# Patient Record
Sex: Male | Born: 1955 | Race: White | Hispanic: No | Smoking: Never smoker
Health system: Southern US, Community
[De-identification: ages and names within clinical notes are randomized; demographics above are authoritative.]

## PROBLEM LIST (undated history)

## (undated) DIAGNOSIS — G629 Polyneuropathy, unspecified: Secondary | ICD-10-CM

## (undated) HISTORY — PX: HEMORRHOID SURGERY: SHX153

---

## 2014-03-31 DIAGNOSIS — G6289 Other specified polyneuropathies: Secondary | ICD-10-CM | POA: Diagnosis present

## 2016-09-05 ENCOUNTER — Other Ambulatory Visit: Payer: Self-pay

## 2016-09-05 ENCOUNTER — Emergency Department
Admission: EM | Admit: 2016-09-05 | Discharge: 2016-09-05 | Disposition: A | Discharge status: Home / Self Care | Payer: Medicare Other | Attending: Emergency Medicine | Admitting: Emergency Medicine

## 2016-09-05 ENCOUNTER — Emergency Department: Payer: Medicare Other

## 2016-09-05 DIAGNOSIS — A084 Viral intestinal infection, unspecified: Secondary | ICD-10-CM | POA: Insufficient documentation

## 2016-09-05 DIAGNOSIS — R1084 Generalized abdominal pain: Secondary | ICD-10-CM | POA: Diagnosis present

## 2016-09-05 HISTORY — DX: Polyneuropathy, unspecified: G62.9

## 2016-09-05 LAB — URINALYSIS, COMPLETE (UACMP) WITH MICROSCOPIC
Bacteria, UA: NONE SEEN
Bilirubin Urine: NEGATIVE
Glucose, UA: NEGATIVE mg/dL
Ketones, ur: NEGATIVE mg/dL
LEUKOCYTES UA: NEGATIVE
Nitrite: NEGATIVE
Protein, ur: NEGATIVE mg/dL
SPECIFIC GRAVITY, URINE: 1.015 (ref 1.005–1.030)
SQUAMOUS EPITHELIAL / LPF: NONE SEEN
pH: 5 (ref 5.0–8.0)

## 2016-09-05 LAB — CBC
HCT: 41.9 % (ref 40.0–52.0)
Hemoglobin: 13.8 g/dL (ref 13.0–18.0)
MCH: 29.7 pg (ref 26.0–34.0)
MCHC: 33 g/dL (ref 32.0–36.0)
MCV: 90 fL (ref 80.0–100.0)
PLATELETS: 234 10*3/uL (ref 150–440)
RBC: 4.65 MIL/uL (ref 4.40–5.90)
RDW: 13.4 % (ref 11.5–14.5)
WBC: 18.8 10*3/uL — ABNORMAL HIGH (ref 3.8–10.6)

## 2016-09-05 LAB — COMPREHENSIVE METABOLIC PANEL
ALT: 21 U/L (ref 17–63)
AST: 32 U/L (ref 15–41)
Albumin: 4.4 g/dL (ref 3.5–5.0)
Alkaline Phosphatase: 69 U/L (ref 38–126)
Anion gap: 9 (ref 5–15)
BUN: 18 mg/dL (ref 6–20)
CALCIUM: 9.2 mg/dL (ref 8.9–10.3)
CHLORIDE: 106 mmol/L (ref 101–111)
CO2: 24 mmol/L (ref 22–32)
CREATININE: 1.24 mg/dL (ref 0.61–1.24)
Glucose, Bld: 134 mg/dL — ABNORMAL HIGH (ref 65–99)
Potassium: 4.3 mmol/L (ref 3.5–5.1)
Sodium: 139 mmol/L (ref 135–145)
Total Bilirubin: 0.3 mg/dL (ref 0.3–1.2)
Total Protein: 8.1 g/dL (ref 6.5–8.1)

## 2016-09-05 LAB — INFLUENZA PANEL BY PCR (TYPE A & B)
Influenza A By PCR: NEGATIVE
Influenza B By PCR: NEGATIVE

## 2016-09-05 LAB — TROPONIN I

## 2016-09-05 LAB — LIPASE, BLOOD: LIPASE: 17 U/L (ref 11–51)

## 2016-09-05 MED ORDER — ONDANSETRON 4 MG PO TBDP: 4.0000 mg | ORAL_TABLET | Freq: Three times a day (TID) | ORAL | 0 refills | Status: DC | PRN

## 2016-09-05 MED ORDER — MAGNESIUM SULFATE 2 GM/50ML IV SOLN
2.0000 g | Freq: Once | INTRAVENOUS | Status: AC
  Administered 2016-09-05: 2 g via INTRAVENOUS
  Filled 2016-09-05: qty 50

## 2016-09-05 MED ORDER — LOPERAMIDE HCL 2 MG PO TABS: 2.0000 mg | ORAL_TABLET | Freq: Four times a day (QID) | ORAL | 0 refills | Status: DC | PRN

## 2016-09-05 MED ORDER — ONDANSETRON HCL 4 MG/2ML IJ SOLN
4.0000 mg | Freq: Once | INTRAMUSCULAR | Status: AC
  Administered 2016-09-05: 4 mg via INTRAVENOUS
  Filled 2016-09-05: qty 2

## 2016-09-05 MED ORDER — SODIUM CHLORIDE 0.9 % IV BOLUS (SEPSIS)
1000.0000 mL | Freq: Once | INTRAVENOUS | Status: AC
  Administered 2016-09-05: 1000 mL via INTRAVENOUS

## 2016-09-05 MED ORDER — KETOROLAC TROMETHAMINE 30 MG/ML IJ SOLN
15.0000 mg | Freq: Once | INTRAMUSCULAR | Status: AC
  Administered 2016-09-05: 15 mg via INTRAVENOUS
  Filled 2016-09-05: qty 1

## 2016-09-05 MED ORDER — IOPAMIDOL (ISOVUE-300) INJECTION 61%
30.0000 mL | Freq: Once | INTRAVENOUS | Status: AC
  Administered 2016-09-05: 30 mL via ORAL

## 2016-09-05 MED ORDER — IOPAMIDOL (ISOVUE-300) INJECTION 61%
100.0000 mL | Freq: Once | INTRAVENOUS | Status: AC | PRN
  Administered 2016-09-05: 100 mL via INTRAVENOUS

## 2016-09-05 NOTE — ED Triage Notes (Signed)
Pt arrived via ems for c/o N/V since 630am and body aches

## 2016-09-05 NOTE — ED Notes (Signed)
Pt is aware that urine sample is needed - at this time is unable to void

## 2016-09-05 NOTE — ED Notes (Signed)
Pt arrived via ems for c/o N/V since 630am and body aches - MD at bedside

## 2016-09-05 NOTE — ED Provider Notes (Signed)
[facility] Emergency Department Provider Note  Time seen: 12:20 PM  I have reviewed the triage vital signs and the nursing notes.   HISTORY  Chief Complaint Nausea and Vomiting    HPI Drew Holt is a 60 y.o. male who presents to the emergency department by EMS for nausea, vomiting, diarrhea. According to the patient he was feeling well yesterday, he awoke early this morning feeling very nauseated, had multiple episodes of vomiting. States he's had 3 episodes of diarrhea since this morning. Unable to keep down any fluids due to the nausea and vomiting so he came to the emergency department for evaluation. States mild diffuse abdominal pain/discomfort. States bodyaches. Denies cough, congestion or fever. Denies black or bloody stool or vomit.  No past medical history on file.  There are no active problems to display for this patient.   No past surgical history on file.  Prior to Admission medications   Not on File    Allergies not on file  No family history on file.  Social History Social History  Substance Use Topics  . Smoking status: Not on file  . Smokeless tobacco: Not on file  . Alcohol use Not on file    Review of Systems Constitutional: Negative for fever. Cardiovascular: Negative for chest pain. Respiratory: Negative for shortness of breath. Gastrointestinal: Mild diffuse abdominal pain. Positive for nausea, vomiting, diarrhea. Genitourinary: Negative for dysuria. Neurological: Negative for headache 10-point ROS otherwise negative.  ____________________________________________   PHYSICAL EXAM:  VITAL SIGNS: ED Triage Vitals [09/05/16 1219]  Enc Vitals Group     BP 105/73     Pulse Rate 81     Resp 15     Temp 97.4 F (36.3 C)     Temp Source Oral     SpO2 96 %     Weight      Height      Head Circumference      Peak Flow      Pain Score      Pain Loc      Pain Edu?      Excl. in GC?     Constitutional: Alert and  oriented. Nauseated, sitting upright in bed holding emesis bag. Very thin habitus. Eyes: Normal exam ENT   Head: Normocephalic and atraumatic.   Mouth/Throat: Mucous membranes are moist. Cardiovascular: Normal rate, regular rhythm. No murmur Respiratory: Normal respiratory effort without tachypnea nor retractions. Breath sounds are clear Gastrointestinal: Soft, mild diffuse abdominal tenderness palpation, no rebound or guarding. No focal tenderness identified. Musculoskeletal: Nontender with normal range of motion in all extremities.  Neurologic:  Normal speech and language. No gross focal neurologic deficits  Skin:  Skin is warm, dry and intact.  Psychiatric: Mood and affect are normal.  ____________________________________________    EKG  EKG reviewed and interpreted by myself shows normal sinus rhythm at 88 bpm, narrow QRS, normal axis, normal intervals, nonspecific but no concerning ST changes.  ____________________________________________    RADIOLOGY  IMPRESSION: 1. Few prominent/borderline dilated jejunal bowel loops in the left upper quadrant but with migration of contrast into distal small bowel, findings are favored to represent mild ileus or gastroenteritis type pattern. No evidence for high-grade mechanical small bowel obstruction. 2. Streaky airspace disease in the left lower lobe may reflect atelectasis or mild infiltrate.  ____________________________________________   INITIAL IMPRESSION / ASSESSMENT AND PLAN / ED COURSE  Pertinent labs & imaging results that were available during my care of the patient were reviewed by  me and considered in my medical decision making (see chart for details).  Patient presents the emergency department with nausea, vomiting, diarrhea since early this morning. Patient states 3 or 4 episodes of diarrhea today, multiple episodes of vomiting unable to keep down any fluids. We will check labs, IV hydrate, and treat nausea and  body aches, we will also send an influenza screen.  Labs are largely within normal limits besides a moderate leukocytosis of 18,000. Given the patient's diffuse abdominal pain/tenderness. We'll obtain a CT scan to further evaluate.  CT scan most consistent with gastroenteritis. Atelectasis first mild infiltrate but no cough, congestion, or fever. Patient will be discharged home with Zofran and over-the-counter loperamide.  ____________________________________________   FINAL CLINICAL IMPRESSION(S) / ED DIAGNOSES  Nausea vomiting diarrhea Muscle cramps Gastroenteritis    Minna AntisKevin Keanthony Poole, MD 09/05/16 [phone]

## 2019-02-21 ENCOUNTER — Emergency Department
Admission: EM | Admit: 2019-02-21 | Discharge: 2019-02-21 | Disposition: A | Discharge status: Home / Self Care | Payer: Medicare HMO | Attending: Emergency Medicine | Admitting: Emergency Medicine

## 2019-02-21 ENCOUNTER — Other Ambulatory Visit: Payer: Self-pay

## 2019-02-21 DIAGNOSIS — Z20828 Contact with and (suspected) exposure to other viral communicable diseases: Secondary | ICD-10-CM | POA: Diagnosis not present

## 2019-02-21 DIAGNOSIS — K2900 Acute gastritis without bleeding: Secondary | ICD-10-CM | POA: Diagnosis not present

## 2019-02-21 DIAGNOSIS — R112 Nausea with vomiting, unspecified: Secondary | ICD-10-CM | POA: Diagnosis present

## 2019-02-21 LAB — COMPREHENSIVE METABOLIC PANEL
ALT: 22 U/L (ref 0–44)
AST: 26 U/L (ref 15–41)
Albumin: 4.6 g/dL (ref 3.5–5.0)
Alkaline Phosphatase: 63 U/L (ref 38–126)
Anion gap: 11 (ref 5–15)
BUN: 17 mg/dL (ref 8–23)
CO2: 26 mmol/L (ref 22–32)
Calcium: 9.1 mg/dL (ref 8.9–10.3)
Chloride: 103 mmol/L (ref 98–111)
Creatinine, Ser: 1.31 mg/dL — ABNORMAL HIGH (ref 0.61–1.24)
GFR calc Af Amer: >60 mL/min (ref >60)
GFR calc non Af Amer: 58 mL/min — ABNORMAL LOW (ref >60)
Glucose, Bld: 111 mg/dL — ABNORMAL HIGH (ref 70–99)
Potassium: 3.7 mmol/L (ref 3.5–5.1)
Sodium: 140 mmol/L (ref 135–145)
Total Bilirubin: 0.6 mg/dL (ref 0.3–1.2)
Total Protein: 8.1 g/dL (ref 6.5–8.1)

## 2019-02-21 LAB — URINALYSIS, COMPLETE (UACMP) WITH MICROSCOPIC
Bacteria, UA: NONE SEEN
Bilirubin Urine: NEGATIVE
Glucose, UA: NEGATIVE mg/dL
Ketones, ur: NEGATIVE mg/dL
Leukocytes,Ua: NEGATIVE
Nitrite: NEGATIVE
Protein, ur: NEGATIVE mg/dL
Specific Gravity, Urine: 1.017 (ref 1.005–1.030)
Squamous Epithelial / LPF: NONE SEEN (ref 0–5)
pH: 5 (ref 5.0–8.0)

## 2019-02-21 LAB — SARS CORONAVIRUS 2 BY RT PCR (HOSPITAL ORDER, PERFORMED IN ~~LOC~~ HOSPITAL LAB): SARS Coronavirus 2: NEGATIVE

## 2019-02-21 LAB — CBC
HCT: 37.5 % — ABNORMAL LOW (ref 39.0–52.0)
Hemoglobin: 12.8 g/dL — ABNORMAL LOW (ref 13.0–17.0)
MCH: 30.9 pg (ref 26.0–34.0)
MCHC: 34.1 g/dL (ref 30.0–36.0)
MCV: 90.6 fL (ref 80.0–100.0)
Platelets: 212 10*3/uL (ref 150–400)
RBC: 4.14 MIL/uL — ABNORMAL LOW (ref 4.22–5.81)
RDW: 12.7 % (ref 11.5–15.5)
WBC: 14.3 10*3/uL — ABNORMAL HIGH (ref 4.0–10.5)
nRBC: 0 % (ref 0.0–0.2)

## 2019-02-21 LAB — LIPASE, BLOOD: Lipase: 26 U/L (ref 11–51)

## 2019-02-21 MED ORDER — ONDANSETRON HCL 4 MG/2ML IJ SOLN
4.0000 mg | Freq: Once | INTRAMUSCULAR | Status: AC
  Administered 2019-02-21: 08:00:00 4 mg via INTRAVENOUS
  Filled 2019-02-21: qty 2

## 2019-02-21 MED ORDER — SODIUM CHLORIDE 0.9% FLUSH: 3.0000 mL | Freq: Once | INTRAVENOUS | Status: DC

## 2019-02-21 MED ORDER — FAMOTIDINE 20 MG PO TABS
20.0000 mg | ORAL_TABLET | Freq: Once | ORAL | Status: AC
  Administered 2019-02-21: 09:00:00 20 mg via ORAL
  Filled 2019-02-21: qty 1

## 2019-02-21 MED ORDER — ACETAMINOPHEN 500 MG PO TABS
1000.0000 mg | ORAL_TABLET | Freq: Once | ORAL | Status: AC
  Administered 2019-02-21: 1000 mg via ORAL
  Filled 2019-02-21: qty 2

## 2019-02-21 MED ORDER — SODIUM CHLORIDE 0.9 % IV SOLN
Freq: Once | INTRAVENOUS | Status: AC
  Administered 2019-02-21: 08:00:00 via INTRAVENOUS

## 2019-02-21 MED ORDER — ONDANSETRON 4 MG PO TBDP: 4.0000 mg | ORAL_TABLET | Freq: Three times a day (TID) | ORAL | 0 refills | Status: DC | PRN

## 2019-02-21 MED ORDER — FAMOTIDINE 20 MG PO TABS: 20.0000 mg | ORAL_TABLET | Freq: Two times a day (BID) | ORAL | 1 refills | Status: DC

## 2019-02-21 NOTE — ED Triage Notes (Signed)
Patient c/o chills, N/V, and generalized body aches.

## 2019-02-21 NOTE — ED Provider Notes (Signed)
[facility] Emergency Department Provider Note       Time seen: ----------------------------------------- 7:29 AM on 02/21/2019 -----------------------------------------   I have reviewed the triage vital signs and the nursing notes.  HISTORY   Chief Complaint Chills; Emesis; and Generalized Body Aches   HPI Drew Holt is a 63 y.o. male with a history of neuropathy who presents to the ED for chills with nausea, vomiting and generalized body aches.  Patient is worried he has coronavirus because there are small children in his family.  He does not drink or smoke.  Past Medical History:  Diagnosis Date  . Neuropathy     There are no active problems to display for this patient.   History reviewed. No pertinent surgical history.  Allergies Patient has no known allergies.  Social History Social History   Tobacco Use  . Smoking status: Never Smoker  . Smokeless tobacco: Never Used  Substance Use Topics  . Alcohol use: No  . Drug use: Not on file   Review of Systems Constitutional: Negative for fever.  Positive for chills Cardiovascular: Negative for chest pain. Respiratory: Negative for shortness of breath. Gastrointestinal: Negative for abdominal pain, positive for nausea and vomiting Musculoskeletal: Negative for back pain.  Positive for muscle aches Skin: Negative for rash. Neurological: Negative for headaches, focal weakness or numbness.  All systems negative/normal/unremarkable except as stated in the HPI  ____________________________________________   PHYSICAL EXAM:  VITAL SIGNS: ED Triage Vitals  Enc Vitals Group     BP 02/21/19 0622 (!) 154/99     Pulse Rate 02/21/19 0622 (!) 119     Resp 02/21/19 0622 18     Temp 02/21/19 0622 99.4 F (37.4 C)     Temp src --      SpO2 02/21/19 0622 97 %     Weight 02/21/19 0624 105 lb 13.1 oz (48 kg)     Height --      Head Circumference --      Peak Flow --      Pain Score 02/21/19  0622 8     Pain Loc --      Pain Edu? --      Excl. in GC? --    Constitutional: Alert and oriented. Well appearing and in no distress. Eyes: Conjunctivae are normal. Normal extraocular movements. Cardiovascular: Normal rate, regular rhythm. No murmurs, rubs, or gallops. Respiratory: Normal respiratory effort without tachypnea nor retractions. Breath sounds are clear and equal bilaterally. No wheezes/rales/rhonchi. Gastrointestinal: Soft and nontender. Normal bowel sounds Musculoskeletal: Nontender with normal range of motion in extremities. No lower extremity tenderness nor edema. Neurologic:  Normal speech and language. No gross focal neurologic deficits are appreciated.  Skin:  Skin is warm, dry and intact. No rash noted. Psychiatric: Mood and affect are normal. Speech and behavior are normal.  ____________________________________________  ED COURSE:  As part of my medical decision making, I reviewed the following data within the electronic MEDICAL RECORD NUMBER History obtained from family if available, nursing notes, old chart and ekg, as well as notes from prior ED visits. Patient presented for chills with nausea and vomiting, we will assess with labs and imaging as indicated at this time.   Procedures  Romere Gardy was evaluated in Emergency Department on 02/21/2019 for the symptoms described in the history of present illness. He was evaluated in the context of the global COVID-19 pandemic, which necessitated consideration that the patient might be at risk for infection with the SARS-CoV-2 virus  that causes COVID-19. Institutional protocols and algorithms that pertain to the evaluation of patients at risk for COVID-19 are in a state of rapid change based on information released by regulatory bodies including the CDC and federal and state organizations. These policies and algorithms were followed during the patient's care in the ED.  ____________________________________________   LABS  (pertinent positives/negatives)  Labs Reviewed  COMPREHENSIVE METABOLIC PANEL - Abnormal; Notable for the following components:      Result Value   Glucose, Bld 111 (*)    Creatinine, Ser 1.31 (*)    GFR calc non Af Amer 58 (*)    All other components within normal limits  CBC - Abnormal; Notable for the following components:   WBC 14.3 (*)    RBC 4.14 (*)    Hemoglobin 12.8 (*)    HCT 37.5 (*)    All other components within normal limits  SARS CORONAVIRUS 2 (HOSPITAL ORDER, PERFORMED IN [facility])  LIPASE, BLOOD  URINALYSIS, COMPLETE (UACMP) WITH MICROSCOPIC   ___________________________________________   DIFFERENTIAL DIAGNOSIS   Gastritis, gastroenteritis, dehydration, electrolyte abnormality  FINAL ASSESSMENT AND PLAN  Gastritis   Plan: The patient had presented for chills with nausea and vomiting and aches. Patient's labs did reveal some leukocytosis.  Patient was given IV fluids and antiemetics.  We also gave antacids.  This is likely combination of gastroenteritis and GERD or gastritis.  Patient is feeling better, he is cleared for outpatient follow-up.   Ulice Dash, MD    Note: This note was generated in part or whole with voice recognition software. Voice recognition is usually quite accurate but there are transcription errors that can and very often do occur. I apologize for any typographical errors that were not detected and corrected.     Emily Filbert, MD 02/21/19 [phone]

## 2019-11-11 ENCOUNTER — Encounter: Payer: Self-pay | Admitting: Emergency Medicine

## 2019-11-11 ENCOUNTER — Other Ambulatory Visit: Payer: Self-pay

## 2019-11-11 DIAGNOSIS — R112 Nausea with vomiting, unspecified: Secondary | ICD-10-CM | POA: Insufficient documentation

## 2019-11-11 DIAGNOSIS — Z79899 Other long term (current) drug therapy: Secondary | ICD-10-CM | POA: Insufficient documentation

## 2019-11-11 DIAGNOSIS — R531 Weakness: Secondary | ICD-10-CM | POA: Diagnosis present

## 2019-11-11 DIAGNOSIS — R1084 Generalized abdominal pain: Secondary | ICD-10-CM | POA: Diagnosis not present

## 2019-11-11 LAB — CBC
HCT: 42.2 % (ref 39.0–52.0)
Hemoglobin: 13.7 g/dL (ref 13.0–17.0)
MCH: 29.9 pg (ref 26.0–34.0)
MCHC: 32.5 g/dL (ref 30.0–36.0)
MCV: 92.1 fL (ref 80.0–100.0)
Platelets: 268 10*3/uL (ref 150–400)
RBC: 4.58 MIL/uL (ref 4.22–5.81)
RDW: 12.5 % (ref 11.5–15.5)
WBC: 14.5 10*3/uL — ABNORMAL HIGH (ref 4.0–10.5)
nRBC: 0 % (ref 0.0–0.2)

## 2019-11-11 LAB — COMPREHENSIVE METABOLIC PANEL
ALT: 23 U/L (ref 0–44)
AST: 26 U/L (ref 15–41)
Albumin: 4.9 g/dL (ref 3.5–5.0)
Alkaline Phosphatase: 74 U/L (ref 38–126)
Anion gap: 10 (ref 5–15)
BUN: 21 mg/dL (ref 8–23)
CO2: 28 mmol/L (ref 22–32)
Calcium: 9.5 mg/dL (ref 8.9–10.3)
Chloride: 102 mmol/L (ref 98–111)
Creatinine, Ser: 1.25 mg/dL — ABNORMAL HIGH (ref 0.61–1.24)
GFR calc Af Amer: >60 mL/min (ref >60)
GFR calc non Af Amer: >60 mL/min (ref >60)
Glucose, Bld: 138 mg/dL — ABNORMAL HIGH (ref 70–99)
Potassium: 4.8 mmol/L (ref 3.5–5.1)
Sodium: 140 mmol/L (ref 135–145)
Total Bilirubin: 0.7 mg/dL (ref 0.3–1.2)
Total Protein: 8.9 g/dL — ABNORMAL HIGH (ref 6.5–8.1)

## 2019-11-11 LAB — LIPASE, BLOOD: Lipase: 23 U/L (ref 11–51)

## 2019-11-11 MED ORDER — ONDANSETRON 4 MG PO TBDP
4.0000 mg | ORAL_TABLET | Freq: Once | ORAL | Status: AC | PRN
  Administered 2019-11-11: 4 mg via ORAL
  Filled 2019-11-11: qty 1

## 2019-11-11 NOTE — ED Triage Notes (Addendum)
Pt arrived via POV - drove himself to ED, with reports of weakness and nausea and vomiting that started today. Pt states he took nausea medication that was prescribed to him a while ago.  Denies any abd pain at this time.   Pt placed in wheelchair on arrival due to weakness and off balance. Pt states he is normally off balance.  Pt is alert and oriented on arrival.  Pt states he has been around others with the same sxs, but were negative for COVID.

## 2019-11-12 ENCOUNTER — Emergency Department: Payer: Medicare HMO

## 2019-11-12 ENCOUNTER — Emergency Department
Admission: EM | Admit: 2019-11-12 | Discharge: 2019-11-12 | Disposition: A | Discharge status: Home / Self Care | Payer: Medicare HMO | Attending: Emergency Medicine | Admitting: Emergency Medicine

## 2019-11-12 DIAGNOSIS — R531 Weakness: Secondary | ICD-10-CM

## 2019-11-12 DIAGNOSIS — R1084 Generalized abdominal pain: Secondary | ICD-10-CM

## 2019-11-12 DIAGNOSIS — R112 Nausea with vomiting, unspecified: Secondary | ICD-10-CM

## 2019-11-12 LAB — TROPONIN I (HIGH SENSITIVITY): Troponin I (High Sensitivity): 3 ng/L (ref <18)

## 2019-11-12 LAB — URINALYSIS, COMPLETE (UACMP) WITH MICROSCOPIC
Bacteria, UA: NONE SEEN
Bilirubin Urine: NEGATIVE
Glucose, UA: NEGATIVE mg/dL
Hgb urine dipstick: NEGATIVE
Ketones, ur: 5 mg/dL — AB
Leukocytes,Ua: NEGATIVE
Nitrite: NEGATIVE
Protein, ur: NEGATIVE mg/dL
Specific Gravity, Urine: 1.02 (ref 1.005–1.030)
WBC, UA: NONE SEEN WBC/hpf (ref 0–5)
pH: 5 (ref 5.0–8.0)

## 2019-11-12 MED ORDER — SODIUM CHLORIDE 0.9 % IV BOLUS
1000.0000 mL | Freq: Once | INTRAVENOUS | Status: AC
  Administered 2019-11-12: 1000 mL via INTRAVENOUS

## 2019-11-12 MED ORDER — LACTULOSE 10 GM/15ML PO SOLN: 20.0000 g | Freq: Every day | ORAL | 0 refills | Status: DC | PRN

## 2019-11-12 MED ORDER — ONDANSETRON HCL 4 MG/2ML IJ SOLN
4.0000 mg | Freq: Once | INTRAMUSCULAR | Status: AC
  Administered 2019-11-12: 4 mg via INTRAVENOUS
  Filled 2019-11-12: qty 2

## 2019-11-12 MED ORDER — ONDANSETRON 4 MG PO TBDP: 4.0000 mg | ORAL_TABLET | Freq: Three times a day (TID) | ORAL | 0 refills | Status: DC | PRN

## 2019-11-12 MED ORDER — MORPHINE SULFATE (PF) 4 MG/ML IV SOLN
4.0000 mg | Freq: Once | INTRAVENOUS | Status: AC
  Administered 2019-11-12: 4 mg via INTRAVENOUS
  Filled 2019-11-12: qty 1

## 2019-11-12 MED ORDER — FAMOTIDINE IN NACL 20-0.9 MG/50ML-% IV SOLN
20.0000 mg | Freq: Once | INTRAVENOUS | Status: AC
  Administered 2019-11-12: 20 mg via INTRAVENOUS
  Filled 2019-11-12: qty 50

## 2019-11-12 NOTE — ED Notes (Signed)
E-signature not working at this time. Pt verbalized understanding of D/C instructions, prescriptions and follow up care with no further questions at this time. Pt in NAD and ambulatory at time of D/C.  

## 2019-11-12 NOTE — ED Provider Notes (Signed)
[facility] Emergency Department Provider Note   ____________________________________________   First MD Initiated Contact with Patient 11/12/19 0121     (approximate)  I have reviewed the triage vital signs and the nursing notes.   HISTORY  Chief Complaint Nausea, Weakness, and Emesis    HPI Drew Holt is a 64 y.o. male who presents to the ED from home with a chief complaint of generalized weakness, nausea and vomiting which began in the morning.  Associated with abdominal cramps when he is vomiting.  No diarrhea; he states last BM yesterday.  Patient takes daily hydrocodone but states he has been weaning himself to lower doses recently.  Family members with similar symptoms who tested negative for COVID-19.  Denies fever, chills, cough, chest pain, shortness of breath, dysuria.  Denies recent travel.       Past Medical History:  Diagnosis Date  . Neuropathy     There are no problems to display for this patient.   History reviewed. No pertinent surgical history.  Prior to Admission medications   Medication Sig Start Date End Date Taking? Authorizing Provider  famotidine (PEPCID) 20 MG tablet Take 1 tablet (20 mg total) by mouth 2 (two) times daily. 02/21/19   Emily Filbert, MD  lactulose (CHRONULAC) 10 GM/15ML solution Take 30 mLs (20 g total) by mouth daily as needed for mild constipation. 11/12/19   Irean Hong, MD  loperamide (IMODIUM A-D) 2 MG tablet Take 1 tablet (2 mg total) by mouth 4 (four) times daily as needed for diarrhea or loose stools. Patient not taking: Reported on 02/21/2019 09/05/16   Minna Antis, MD  ondansetron (ZOFRAN ODT) 4 MG disintegrating tablet Take 1 tablet (4 mg total) by mouth every 8 (eight) hours as needed for nausea or vomiting. 11/12/19   Irean Hong, MD    Allergies Patient has no known allergies.  No family history on file.  Social History Social History   Tobacco Use  . Smoking status: Never  Smoker  . Smokeless tobacco: Never Used  Substance Use Topics  . Alcohol use: No  . Drug use: Not on file    Review of Systems  Constitutional: No fever/chills Eyes: No visual changes. ENT: No sore throat. Cardiovascular: Denies chest pain. Respiratory: Denies shortness of breath. Gastrointestinal: Positive for abdominal cramps, nausea and vomiting.  No diarrhea.  No constipation. Genitourinary: Negative for dysuria. Musculoskeletal: Negative for back pain. Skin: Negative for rash. Neurological: Negative for headaches, focal weakness or numbness.   ____________________________________________   PHYSICAL EXAM:  VITAL SIGNS: ED Triage Vitals  Enc Vitals Group     BP 11/11/19 2237 116/69     Pulse Rate 11/11/19 2237 (!) 110     Resp 11/11/19 2237 18     Temp 11/11/19 2237 98.5 F (36.9 C)     Temp Source 11/11/19 2237 Oral     SpO2 11/11/19 2237 97 %     Weight 11/11/19 2239 122 lb (55.3 kg)     Height 11/11/19 2239 5\' 4"  (1.626 m)     Head Circumference --      Peak Flow --      Pain Score 11/11/19 2238 8     Pain Loc --      Pain Edu? --      Excl. in GC? --     Constitutional: Alert and oriented.  Uncomfortable appearing and in mild acute distress. Eyes: Conjunctivae are normal. PERRL. EOMI. Head: Atraumatic. Nose: No  congestion/rhinnorhea. Mouth/Throat: Mucous membranes are mildly dry.   Neck: No stridor.   Cardiovascular: Normal rate, regular rhythm. Grossly normal heart sounds.  Good peripheral circulation. Respiratory: Normal respiratory effort.  No retractions. Lungs CTAB. Gastrointestinal: Soft and nontender to light or deep palpation. No distention. No abdominal bruits. No CVA tenderness. Musculoskeletal: No lower extremity tenderness nor edema.  No joint effusions. Neurologic:  Normal speech and language. No gross focal neurologic deficits are appreciated.  Skin:  Skin is pale, warm, dry and intact. No rash noted. Psychiatric: Mood and affect are  normal. Speech and behavior are normal.  ____________________________________________   LABS (all labs ordered are listed, but only abnormal results are displayed)  Labs Reviewed  COMPREHENSIVE METABOLIC PANEL - Abnormal; Notable for the following components:      Result Value   Glucose, Bld 138 (*)    Creatinine, Ser 1.25 (*)    Total Protein 8.9 (*)    All other components within normal limits  CBC - Abnormal; Notable for the following components:   WBC 14.5 (*)    All other components within normal limits  URINALYSIS, COMPLETE (UACMP) WITH MICROSCOPIC - Abnormal; Notable for the following components:   Color, Urine YELLOW (*)    APPearance HAZY (*)    Ketones, ur 5 (*)    All other components within normal limits  LIPASE, BLOOD  TROPONIN I (HIGH SENSITIVITY)   ____________________________________________  EKG  ED ECG REPORT I, Carel Schnee J, the attending physician, personally viewed and interpreted this ECG.   Date: 11/12/2019  EKG Time: 2245  Rate: 108  Rhythm: sinus tachycardia  Axis: Normal  Intervals:none  ST&T Change: Nonspecific  ____________________________________________  RADIOLOGY  ED MD interpretation: No SBO, moderate stool burden  Official radiology report(s): DG Abd Acute W/Chest  Result Date: 11/12/2019 CLINICAL DATA:  Weakness, nausea and vomiting EXAM: DG ABDOMEN ACUTE W/ 1V CHEST COMPARISON:  09/05/2016 FINDINGS: Supine and upright frontal views of the abdomen as well as an upright frontal view of the chest are obtained. Cardiac silhouette is unremarkable. No airspace disease, effusion, or pneumothorax. No bowel obstruction or ileus. No masses or abnormal calcifications. No free gas in the greater peritoneal sac. There are no acute bony abnormalities. IMPRESSION: 1. No acute intrathoracic process. 2. Unremarkable bowel gas pattern. Electronically Signed   By: Sharlet Salina M.D.   On: 11/12/2019 02:10     ____________________________________________   PROCEDURES  Procedure(s) performed (including Critical Care):  Procedures   ____________________________________________   INITIAL IMPRESSION / ASSESSMENT AND PLAN / ED COURSE  As part of my medical decision making, I reviewed the following data within the electronic MEDICAL RECORD NUMBER Nursing notes reviewed and incorporated, Labs reviewed, EKG interpreted, Old chart reviewed, Radiograph reviewed and Notes from prior ED visits     Erik Burkett was evaluated in Emergency Department on 11/12/2019 for the symptoms described in the history of present illness. He was evaluated in the context of the global COVID-19 pandemic, which necessitated consideration that the patient might be at risk for infection with the SARS-CoV-2 virus that causes COVID-19. Institutional protocols and algorithms that pertain to the evaluation of patients at risk for COVID-19 are in a state of rapid change based on information released by regulatory bodies including the CDC and federal and state organizations. These policies and algorithms were followed during the patient's care in the ED.    64 year old male who presents with abdominal cramps, nausea and vomiting. Differential diagnosis includes, but is not limited to,  biliary disease (biliary colic, acute cholecystitis, cholangitis, choledocholithiasis, etc), intrathoracic causes for epigastric abdominal pain including ACS, gastritis, duodenitis, pancreatitis, small bowel or large bowel obstruction, abdominal aortic aneurysm, hernia, and ulcer(s).  Laboratory results demonstrate mild leukocytosis, normal LFTs and lipase.  Will check troponin, urinalysis, abdominal x-ray series.  Initiate IV fluid resuscitation, IV Pepcid, Zofran and reassess.   Clinical Course as of Nov 11 599  Wed Nov 12, 2019  0318 Patient feeling significantly better and requesting something to drink.  Will administer ice chips.  Requesting pain  medicine for his neuropathy.  When queried, patient states he has been on Neurontin and Lyrica previously without effect.  Currently more concerned with restless legs/neuropathy than nausea or vomiting.  Abdominal reexamination remains soft and benign.  Will discharge home on Zofran and lactulose as his KUB demonstrates moderate stool burden.  Strict return precautions given.  Patient verbalizes understanding agrees with plan of care.   [JS]    Clinical Course User Index [JS] Paulette Blanch, MD     ____________________________________________   FINAL CLINICAL IMPRESSION(S) / ED DIAGNOSES  Final diagnoses:  Generalized weakness  Non-intractable vomiting with nausea, unspecified vomiting type  Generalized abdominal pain     ED Discharge Orders         Ordered    lactulose (CHRONULAC) 10 GM/15ML solution  Daily PRN     11/12/19 0338    ondansetron (ZOFRAN ODT) 4 MG disintegrating tablet  Every 8 hours PRN     11/12/19 0263           Note:  This document was prepared using Dragon voice recognition software and may include unintentional dictation errors.   Paulette Blanch, MD 11/12/19 [phone]

## 2019-11-12 NOTE — Discharge Instructions (Signed)
1.  You may take Zofran as needed for nausea/vomiting. 2.  Take Lactulose as needed for bowel movements.  Also start a daily stool softener.  The pain medicine you take causes constipation. 3.  Return to the ER for worsening symptoms, persistent vomiting, difficulty breathing or other concerns.

## 2019-11-12 NOTE — ED Notes (Signed)
Pt up for discharge but stated that he drove himself in and he has the only car. There is nobody available to come and get him at this time. He received 4mg  morphine at 0357 and will be cleared to drive at as directed by Dr [id number].

## 2020-03-29 ENCOUNTER — Other Ambulatory Visit: Payer: Self-pay

## 2020-03-29 ENCOUNTER — Encounter: Payer: Self-pay | Admitting: Emergency Medicine

## 2020-03-29 ENCOUNTER — Emergency Department
Admission: EM | Admit: 2020-03-29 | Discharge: 2020-03-29 | Disposition: A | Discharge status: Home / Self Care | Payer: Medicare HMO | Attending: Emergency Medicine | Admitting: Emergency Medicine

## 2020-03-29 DIAGNOSIS — R1010 Upper abdominal pain, unspecified: Secondary | ICD-10-CM | POA: Diagnosis present

## 2020-03-29 DIAGNOSIS — K2900 Acute gastritis without bleeding: Secondary | ICD-10-CM | POA: Diagnosis not present

## 2020-03-29 LAB — CBC
HCT: 34 % — ABNORMAL LOW (ref 39.0–52.0)
Hemoglobin: 11.3 g/dL — ABNORMAL LOW (ref 13.0–17.0)
MCH: 30.7 pg (ref 26.0–34.0)
MCHC: 33.2 g/dL (ref 30.0–36.0)
MCV: 92.4 fL (ref 80.0–100.0)
Platelets: 255 10*3/uL (ref 150–400)
RBC: 3.68 MIL/uL — ABNORMAL LOW (ref 4.22–5.81)
RDW: 12.5 % (ref 11.5–15.5)
WBC: 14.4 10*3/uL — ABNORMAL HIGH (ref 4.0–10.5)
nRBC: 0 % (ref 0.0–0.2)

## 2020-03-29 LAB — COMPREHENSIVE METABOLIC PANEL
ALT: 13 U/L (ref 0–44)
AST: 19 U/L (ref 15–41)
Albumin: 4.6 g/dL (ref 3.5–5.0)
Alkaline Phosphatase: 70 U/L (ref 38–126)
Anion gap: 9 (ref 5–15)
BUN: 16 mg/dL (ref 8–23)
CO2: 27 mmol/L (ref 22–32)
Calcium: 9.3 mg/dL (ref 8.9–10.3)
Chloride: 98 mmol/L (ref 98–111)
Creatinine, Ser: 1.22 mg/dL (ref 0.61–1.24)
GFR calc Af Amer: >60 mL/min (ref >60)
GFR calc non Af Amer: >60 mL/min (ref >60)
Glucose, Bld: 118 mg/dL — ABNORMAL HIGH (ref 70–99)
Potassium: 4.5 mmol/L (ref 3.5–5.1)
Sodium: 134 mmol/L — ABNORMAL LOW (ref 135–145)
Total Bilirubin: 0.7 mg/dL (ref 0.3–1.2)
Total Protein: 8.6 g/dL — ABNORMAL HIGH (ref 6.5–8.1)

## 2020-03-29 LAB — LIPASE, BLOOD: Lipase: 22 U/L (ref 11–51)

## 2020-03-29 MED ORDER — ALUM & MAG HYDROXIDE-SIMETH 200-200-20 MG/5ML PO SUSP
30.0000 mL | Freq: Once | ORAL | Status: AC
  Administered 2020-03-29: 30 mL via ORAL
  Filled 2020-03-29: qty 30

## 2020-03-29 MED ORDER — SODIUM CHLORIDE 0.9% FLUSH: 3.0000 mL | Freq: Once | INTRAVENOUS | Status: DC

## 2020-03-29 MED ORDER — LIDOCAINE VISCOUS HCL 2 % MT SOLN
15.0000 mL | Freq: Once | OROMUCOSAL | Status: AC
  Administered 2020-03-29: 15 mL via ORAL
  Filled 2020-03-29: qty 15

## 2020-03-29 MED ORDER — PANTOPRAZOLE SODIUM 20 MG PO TBEC: 20.0000 mg | DELAYED_RELEASE_TABLET | Freq: Every day | ORAL | 1 refills | Status: DC

## 2020-03-29 MED ORDER — ONDANSETRON 4 MG PO TBDP: 4.0000 mg | ORAL_TABLET | Freq: Three times a day (TID) | ORAL | 0 refills | Status: DC | PRN

## 2020-03-29 MED ORDER — SUCRALFATE 1 G PO TABS: 1.0000 g | ORAL_TABLET | Freq: Four times a day (QID) | ORAL | 0 refills | Status: DC

## 2020-03-29 NOTE — ED Triage Notes (Signed)
Abdominal pain and nausea since last night.    AAOx3.  Skin warm and dry. NAD

## 2020-03-29 NOTE — ED Provider Notes (Signed)
[facility] Emergency Department Provider Note   ____________________________________________    I have reviewed the triage vital signs and the nursing notes.   HISTORY  Chief Complaint Abdominal Pain and Nausea     HPI Drew Holt is a 64 y.o. male who presents with upper abdominal pain which he describes as burning in intensity.  Patient reports this is typically his gastritis acting up.  He does not drink alcohol, does not smoke.  Denies a history of pancreatitis.  The pain does not radiate and is moderate in intensity.  He has not taken anything for this.  No fevers or chills or cough.  No chest pain  Past Medical History:  Diagnosis Date  . Neuropathy     There are no problems to display for this patient.   History reviewed. No pertinent surgical history.  Prior to Admission medications   Medication Sig Start Date End Date Taking? Authorizing Provider  ondansetron (ZOFRAN ODT) 4 MG disintegrating tablet Take 1 tablet (4 mg total) by mouth every 8 (eight) hours as needed. 03/29/20   Jene Every, MD  pantoprazole (PROTONIX) 20 MG tablet Take 1 tablet (20 mg total) by mouth daily. 03/29/20 03/29/21  Jene Every, MD  sucralfate (CARAFATE) 1 g tablet Take 1 tablet (1 g total) by mouth 4 (four) times daily for 15 days. 03/29/20 04/13/20  Jene Every, MD  famotidine (PEPCID) 20 MG tablet Take 1 tablet (20 mg total) by mouth 2 (two) times daily. 02/21/19 03/29/20  Emily Filbert, MD     Allergies Patient has no known allergies.  No family history on file.  Social History Social History   Tobacco Use  . Smoking status: Never Smoker  . Smokeless tobacco: Never Used  Substance Use Topics  . Alcohol use: No  . Drug use: Not on file    Review of Systems  Constitutional: No fever/chills Eyes: No visual changes.  ENT: No sore throat. Cardiovascular: Denies chest pain. Respiratory: Denies shortness of breath. Gastrointestinal: As  above Genitourinary: Negative for dysuria. Musculoskeletal: Negative for back pain. Skin: Negative for rash. Neurological: Negative for headaches   ____________________________________________   PHYSICAL EXAM:  VITAL SIGNS: ED Triage Vitals  Enc Vitals Group     BP 03/29/20 1324 113/79     Pulse Rate 03/29/20 1324 (!) 115     Resp 03/29/20 1324 16     Temp 03/29/20 1324 98.6 F (37 C)     Temp Source 03/29/20 1324 Oral     SpO2 03/29/20 1324 95 %     Weight 03/29/20 1325 55.3 kg (121 lb 14.6 oz)     Height 03/29/20 1325 1.626 m (5\' 4" )     Head Circumference --      Peak Flow --      Pain Score 03/29/20 1325 0     Pain Loc --      Pain Edu? --      Excl. in GC? --     Constitutional: Alert and oriented. No acute distress. Pleasant and interactive  Mouth/Throat: Mucous membranes are moist.    Cardiovascular: Normal rate, regular rhythm. Grossly normal heart sounds.  Good peripheral circulation. Respiratory: Normal respiratory effort.  No retractions. Lungs CTAB. Gastrointestinal: Soft and nontender. No distention.  No CVA tenderness.  Reassuring exam Musculoskeletal: No lower extremity tenderness nor edema.  Warm and well perfused Neurologic:  Normal speech and language. No gross focal neurologic deficits are appreciated.  Skin:  Skin is  warm, dry and intact. No rash noted. Psychiatric: Mood and affect are normal. Speech and behavior are normal.  ____________________________________________   LABS (all labs ordered are listed, but only abnormal results are displayed)  Labs Reviewed  COMPREHENSIVE METABOLIC PANEL - Abnormal; Notable for the following components:      Result Value   Sodium 134 (*)    Glucose, Bld 118 (*)    Total Protein 8.6 (*)    All other components within normal limits  CBC - Abnormal; Notable for the following components:   WBC 14.4 (*)    RBC 3.68 (*)    Hemoglobin 11.3 (*)    HCT 34.0 (*)    All other components within normal limits    LIPASE, BLOOD   ____________________________________________  EKG  None ____________________________________________  RADIOLOGY  None ____________________________________________   PROCEDURES  Procedure(s) performed: No  Procedures   Critical Care performed: No ____________________________________________   INITIAL IMPRESSION / ASSESSMENT AND PLAN / ED COURSE  Pertinent labs & imaging results that were available during my care of the patient were reviewed by me and considered in my medical decision making (see chart for details).  Patient well-appearing and in no acute distress.  Differential includes gastritis, PUD, pancreatitis  Lab work is notable for mild elevation of white blood cell count blood normal lipase  Patient treated with GI cocktail with near complete resolution of pain.  We will start him on PPI, Carafate, close follow-up with GI    ____________________________________________   FINAL CLINICAL IMPRESSION(S) / ED DIAGNOSES  Final diagnoses:  Acute gastritis without hemorrhage, unspecified gastritis type        Note:  This document was prepared using Dragon voice recognition software and may include unintentional dictation errors.   Jene Every, MD 03/29/20 2213

## 2020-04-30 DIAGNOSIS — Z5321 Procedure and treatment not carried out due to patient leaving prior to being seen by health care provider: Secondary | ICD-10-CM | POA: Insufficient documentation

## 2020-04-30 DIAGNOSIS — R1110 Vomiting, unspecified: Secondary | ICD-10-CM | POA: Insufficient documentation

## 2020-05-01 ENCOUNTER — Encounter: Payer: Self-pay | Admitting: Internal Medicine

## 2020-05-01 ENCOUNTER — Other Ambulatory Visit: Payer: Self-pay

## 2020-05-01 ENCOUNTER — Emergency Department
Admission: EM | Admit: 2020-05-01 | Discharge: 2020-05-01 | Disposition: A | Discharge status: Home / Self Care | Payer: Medicare HMO | Attending: Emergency Medicine | Admitting: Emergency Medicine

## 2020-05-01 ENCOUNTER — Encounter: Payer: Self-pay | Admitting: Emergency Medicine

## 2020-05-01 ENCOUNTER — Observation Stay
Admission: EM | Admit: 2020-05-01 | Discharge: 2020-05-02 | Disposition: A | Discharge status: Home / Self Care | Payer: Medicare HMO | Attending: Internal Medicine | Admitting: Internal Medicine

## 2020-05-01 ENCOUNTER — Emergency Department: Payer: Medicare HMO

## 2020-05-01 DIAGNOSIS — R112 Nausea with vomiting, unspecified: Secondary | ICD-10-CM | POA: Diagnosis not present

## 2020-05-01 DIAGNOSIS — J189 Pneumonia, unspecified organism: Secondary | ICD-10-CM | POA: Diagnosis not present

## 2020-05-01 DIAGNOSIS — R197 Diarrhea, unspecified: Secondary | ICD-10-CM | POA: Diagnosis not present

## 2020-05-01 DIAGNOSIS — G9341 Metabolic encephalopathy: Secondary | ICD-10-CM | POA: Diagnosis not present

## 2020-05-01 DIAGNOSIS — R079 Chest pain, unspecified: Secondary | ICD-10-CM | POA: Diagnosis present

## 2020-05-01 DIAGNOSIS — Z20822 Contact with and (suspected) exposure to covid-19: Secondary | ICD-10-CM | POA: Insufficient documentation

## 2020-05-01 DIAGNOSIS — E876 Hypokalemia: Secondary | ICD-10-CM | POA: Diagnosis not present

## 2020-05-01 DIAGNOSIS — K219 Gastro-esophageal reflux disease without esophagitis: Secondary | ICD-10-CM | POA: Diagnosis not present

## 2020-05-01 LAB — CBC
HCT: 30.3 % — ABNORMAL LOW (ref 39.0–52.0)
HCT: 31.2 % — ABNORMAL LOW (ref 39.0–52.0)
HCT: 32.7 % — ABNORMAL LOW (ref 39.0–52.0)
Hemoglobin: 10.5 g/dL — ABNORMAL LOW (ref 13.0–17.0)
Hemoglobin: 11 g/dL — ABNORMAL LOW (ref 13.0–17.0)
Hemoglobin: 9.9 g/dL — ABNORMAL LOW (ref 13.0–17.0)
MCH: 30.4 pg (ref 26.0–34.0)
MCH: 30.6 pg (ref 26.0–34.0)
MCH: 30.7 pg (ref 26.0–34.0)
MCHC: 32.7 g/dL (ref 30.0–36.0)
MCHC: 33.6 g/dL (ref 30.0–36.0)
MCHC: 33.7 g/dL (ref 30.0–36.0)
MCV: 91.1 fL (ref 80.0–100.0)
MCV: 91.2 fL (ref 80.0–100.0)
MCV: 92.9 fL (ref 80.0–100.0)
Platelets: 305 10*3/uL (ref 150–400)
Platelets: 316 10*3/uL (ref 150–400)
Platelets: 323 10*3/uL (ref 150–400)
RBC: 3.26 MIL/uL — ABNORMAL LOW (ref 4.22–5.81)
RBC: 3.42 MIL/uL — ABNORMAL LOW (ref 4.22–5.81)
RBC: 3.59 MIL/uL — ABNORMAL LOW (ref 4.22–5.81)
RDW: 12.6 % (ref 11.5–15.5)
RDW: 12.7 % (ref 11.5–15.5)
RDW: 12.7 % (ref 11.5–15.5)
WBC: 11.9 10*3/uL — ABNORMAL HIGH (ref 4.0–10.5)
WBC: 13.9 10*3/uL — ABNORMAL HIGH (ref 4.0–10.5)
WBC: 14.7 10*3/uL — ABNORMAL HIGH (ref 4.0–10.5)
nRBC: 0 % (ref 0.0–0.2)
nRBC: 0 % (ref 0.0–0.2)
nRBC: 0 % (ref 0.0–0.2)

## 2020-05-01 LAB — COMPREHENSIVE METABOLIC PANEL
ALT: 17 U/L (ref 0–44)
AST: 24 U/L (ref 15–41)
Albumin: 3.9 g/dL (ref 3.5–5.0)
Alkaline Phosphatase: 69 U/L (ref 38–126)
Anion gap: 11 (ref 5–15)
BUN: 17 mg/dL (ref 8–23)
CO2: 27 mmol/L (ref 22–32)
Calcium: 9.2 mg/dL (ref 8.9–10.3)
Chloride: 98 mmol/L (ref 98–111)
Creatinine, Ser: 1.1 mg/dL (ref 0.61–1.24)
GFR calc Af Amer: >60 mL/min (ref >60)
GFR calc non Af Amer: >60 mL/min (ref >60)
Glucose, Bld: 179 mg/dL — ABNORMAL HIGH (ref 70–99)
Potassium: 4.1 mmol/L (ref 3.5–5.1)
Sodium: 136 mmol/L (ref 135–145)
Total Bilirubin: 0.7 mg/dL (ref 0.3–1.2)
Total Protein: 8.5 g/dL — ABNORMAL HIGH (ref 6.5–8.1)

## 2020-05-01 LAB — POC SARS CORONAVIRUS 2 AG: SARS Coronavirus 2 Ag: NEGATIVE

## 2020-05-01 LAB — BASIC METABOLIC PANEL
Anion gap: 12 (ref 5–15)
BUN: 15 mg/dL (ref 8–23)
CO2: 27 mmol/L (ref 22–32)
Calcium: 9.2 mg/dL (ref 8.9–10.3)
Chloride: 97 mmol/L — ABNORMAL LOW (ref 98–111)
Creatinine, Ser: 1.08 mg/dL (ref 0.61–1.24)
GFR calc Af Amer: >60 mL/min (ref >60)
GFR calc non Af Amer: >60 mL/min (ref >60)
Glucose, Bld: 125 mg/dL — ABNORMAL HIGH (ref 70–99)
Potassium: 3.4 mmol/L — ABNORMAL LOW (ref 3.5–5.1)
Sodium: 136 mmol/L (ref 135–145)

## 2020-05-01 LAB — LIPASE, BLOOD: Lipase: 24 U/L (ref 11–51)

## 2020-05-01 LAB — HIV ANTIBODY (ROUTINE TESTING W REFLEX): HIV Screen 4th Generation wRfx: NONREACTIVE

## 2020-05-01 LAB — TROPONIN I (HIGH SENSITIVITY)
Troponin I (High Sensitivity): 8 ng/L (ref <18)
Troponin I (High Sensitivity): 7 ng/L (ref <18)
Troponin I (High Sensitivity): 6 ng/L (ref <18)

## 2020-05-01 LAB — FIBRIN DERIVATIVES D-DIMER (ARMC ONLY): Fibrin derivatives D-dimer (ARMC): 1502.12 ng/mL (FEU) — ABNORMAL HIGH (ref 0.00–499.00)

## 2020-05-01 LAB — SARS CORONAVIRUS 2 BY RT PCR (HOSPITAL ORDER, PERFORMED IN ~~LOC~~ HOSPITAL LAB): SARS Coronavirus 2: NEGATIVE

## 2020-05-01 MED ORDER — SODIUM CHLORIDE 0.9 % IV BOLUS
1000.0000 mL | Freq: Once | INTRAVENOUS | Status: AC
  Administered 2020-05-01: 1000 mL via INTRAVENOUS

## 2020-05-01 MED ORDER — DM-GUAIFENESIN ER 30-600 MG PO TB12: 1.0000 | ORAL_TABLET | Freq: Two times a day (BID) | ORAL | Status: DC | PRN

## 2020-05-01 MED ORDER — LIDOCAINE VISCOUS HCL 2 % MT SOLN
15.0000 mL | Freq: Once | OROMUCOSAL | Status: AC
  Administered 2020-05-01: 15 mL via ORAL
  Filled 2020-05-01: qty 15

## 2020-05-01 MED ORDER — HYDROCODONE-ACETAMINOPHEN 10-325 MG PO TABS
1.0000 | ORAL_TABLET | ORAL | Status: DC | PRN
  Administered 2020-05-01 – 2020-05-02 (×4): 1 via ORAL
  Filled 2020-05-01 (×4): qty 1

## 2020-05-01 MED ORDER — DICYCLOMINE HCL 10 MG PO CAPS
10.0000 mg | ORAL_CAPSULE | Freq: Once | ORAL | Status: DC
  Filled 2020-05-01: qty 1

## 2020-05-01 MED ORDER — DICYCLOMINE HCL 10 MG/5ML PO SOLN
10.0000 mg | Freq: Once | ORAL | Status: DC
  Filled 2020-05-01: qty 5

## 2020-05-01 MED ORDER — ALUM & MAG HYDROXIDE-SIMETH 200-200-20 MG/5ML PO SUSP
30.0000 mL | Freq: Once | ORAL | Status: AC
  Administered 2020-05-01: 30 mL via ORAL
  Filled 2020-05-01: qty 30

## 2020-05-01 MED ORDER — IOHEXOL 350 MG/ML SOLN
75.0000 mL | Freq: Once | INTRAVENOUS | Status: AC | PRN
  Administered 2020-05-01: 75 mL via INTRAVENOUS

## 2020-05-01 MED ORDER — PANTOPRAZOLE SODIUM 20 MG PO TBEC
20.0000 mg | DELAYED_RELEASE_TABLET | Freq: Every day | ORAL | Status: DC
  Filled 2020-05-01: qty 1

## 2020-05-01 MED ORDER — ALBUTEROL SULFATE (2.5 MG/3ML) 0.083% IN NEBU: 3.0000 mL | INHALATION_SOLUTION | RESPIRATORY_TRACT | Status: DC | PRN

## 2020-05-01 MED ORDER — ONDANSETRON HCL 4 MG/2ML IJ SOLN
4.0000 mg | Freq: Three times a day (TID) | INTRAMUSCULAR | Status: DC | PRN
  Administered 2020-05-01: 4 mg via INTRAVENOUS
  Filled 2020-05-01: qty 2

## 2020-05-01 MED ORDER — ONDANSETRON HCL 4 MG/2ML IJ SOLN
4.0000 mg | Freq: Four times a day (QID) | INTRAMUSCULAR | Status: DC | PRN
  Administered 2020-05-01: 4 mg via INTRAVENOUS
  Filled 2020-05-01: qty 2

## 2020-05-01 MED ORDER — ACETAMINOPHEN 325 MG PO TABS: 650.0000 mg | ORAL_TABLET | Freq: Four times a day (QID) | ORAL | Status: DC | PRN

## 2020-05-01 MED ORDER — MELATONIN 5 MG PO TABS
5.0000 mg | ORAL_TABLET | Freq: Every day | ORAL | Status: DC
  Administered 2020-05-02: 5 mg via ORAL
  Filled 2020-05-01: qty 1

## 2020-05-01 MED ORDER — SODIUM CHLORIDE 0.9 % IV SOLN
1.0000 g | Freq: Once | INTRAVENOUS | Status: DC
  Filled 2020-05-01: qty 10

## 2020-05-01 MED ORDER — SODIUM CHLORIDE 0.9 % IV SOLN
500.0000 mg | INTRAVENOUS | Status: DC
  Administered 2020-05-01: 500 mg via INTRAVENOUS
  Filled 2020-05-01 (×2): qty 500

## 2020-05-01 MED ORDER — ENOXAPARIN SODIUM 40 MG/0.4ML ~~LOC~~ SOLN
40.0000 mg | SUBCUTANEOUS | Status: DC
  Administered 2020-05-01: 40 mg via SUBCUTANEOUS
  Filled 2020-05-01: qty 0.4

## 2020-05-01 MED ORDER — SODIUM CHLORIDE 0.9 % IV SOLN
500.0000 mg | Freq: Once | INTRAVENOUS | Status: DC
  Filled 2020-05-01: qty 500

## 2020-05-01 MED ORDER — SODIUM CHLORIDE 0.9 % IV SOLN
1.0000 g | INTRAVENOUS | Status: DC
  Administered 2020-05-01: 1 g via INTRAVENOUS
  Filled 2020-05-01: qty 10

## 2020-05-01 MED ORDER — POTASSIUM CHLORIDE CRYS ER 20 MEQ PO TBCR
40.0000 meq | EXTENDED_RELEASE_TABLET | Freq: Once | ORAL | Status: AC
  Administered 2020-05-01: 40 meq via ORAL
  Filled 2020-05-01: qty 2

## 2020-05-01 NOTE — ED Notes (Signed)
Transport came to get pt and states pt was vomiting by toilet- will give PRN zofran

## 2020-05-01 NOTE — ED Triage Notes (Signed)
Patient states that Sunday he left some food out on the table and ate it later. Patient states that on Monday he started vomiting. Patient states that he thinks he has food poisoning .

## 2020-05-01 NOTE — H&P (Signed)
History and Physical    Drew Holt ZOX:[id number] DOB: May 12, 1956 DOA: 05/01/2020  Referring MD/NP/PA:   PCP: System, Provider Not In   Patient coming from:  The patient is coming from home.  At baseline, pt is independent for most of ADL.        Chief Complaint: Nausea, vomiting, diarrhea, chest pain, cough, confusion  HPI: Drew Holt is a 64 y.o. male with medical history significant of GERD, neuropathy, who presents with nausea, vomiting, diarrhea, chest pain and cough.  Per his daughter, patient developed nausea, vomiting, some diarrhea and mild abdominal bloating discomfort on Monday after he ate some leftover food.  His GI symptoms have improved already.  He did not have diarrhea in the past 2 days.  He suspects that he has food poisoning. Daughter reported that pt was confused yesterday, currently patient is alert, oriented x3. No unilateral numbness or tingling to extremities, no facial droop or slurred speech. Patient has dry cough and mild pleuritic chest pain.  Denies shortness of breath.  No fever or chills.  His chest pain has almost resolved currently per patient. Daughter states that patient received 2 dose of Covid19 vaccine more than a month ago.  ED Course: pt was found to have WBC 11.9, troponin negative X 3 times, negative COVID-19 Ag test, pending COVID-19 PCR, D-dimer 1502, potassium 3.4, creatinine 1.08, BUN 15.  Temperature 99, blood pressure 123/73, tachycardia, RR 14, oxygen saturation 96% on room air.  Chest x-ray showed multifocal infiltration in the left basilar field.  CT angiogram was negative for central PE, but showed multifocal infiltration.  Patient is placed on MedSurg bed for observation.  Review of Systems:   General: no fevers, chills, no body weight gain, has poor appetite, has fatigue HEENT: no blurry vision, hearing changes or sore throat Respiratory: no dyspnea, has coughing, no wheezing CV: has chest pain, no palpitations GI: has nausea, vomiting,  abdominal pain, diarrhea GU: no dysuria, burning on urination, increased urinary frequency, hematuria  Ext: no leg edema Neuro: no unilateral weakness, numbness, or tingling, no vision change or hearing loss Skin: no rash, no skin tear. MSK: No muscle spasm, no deformity, no limitation of range of movement in spin Heme: No easy bruising.  Travel history: No recent long distant travel.  Allergy: No Known Allergies  Past Medical History:  Diagnosis Date  . Neuropathy     Past Surgical History:  Procedure Laterality Date  . HEMORRHOID SURGERY      Social History:  reports that he has never smoked. He has never used smokeless tobacco. He reports that he does not drink alcohol and does not use drugs.  Family History:  Family History  Problem Relation Age of Onset  . Diabetes Mellitus II Sister   . Liver disease Sister      Prior to Admission medications   Medication Sig Start Date End Date Taking? Authorizing Provider  HYDROcodone-acetaminophen (NORCO) 10-325 MG tablet Take 1 tablet by mouth every 4 (four) hours as needed. 04/28/20  Yes [provider]  ondansetron (ZOFRAN ODT) 4 MG disintegrating tablet Take 1 tablet (4 mg total) by mouth every 8 (eight) hours as needed. 03/29/20  Yes Jene Every, MD  pantoprazole (PROTONIX) 20 MG tablet Take 1 tablet (20 mg total) by mouth daily. 03/29/20 03/29/21 Yes Jene Every, MD  famotidine (PEPCID) 20 MG tablet Take 1 tablet (20 mg total) by mouth 2 (two) times daily. 02/21/19 03/29/20  Emily Filbert, MD    Physical  Exam: Vitals:   05/01/20 1119 05/01/20 1300 05/01/20 1330 05/01/20 1400  BP: 123/73     Pulse: 84     Resp: 20 16 16 14   Temp:      TempSrc:      SpO2: 96%     Weight:      Height:       General: Not in acute distress HEENT:       Eyes: PERRL, EOMI, no scleral icterus.       ENT: No discharge from the ears and nose, no pharynx injection, no tonsillar enlargement.        Neck: No JVD, no bruit, no  mass felt. Heme: No neck lymph node enlargement. Cardiac: S1/S2, RRR, No murmurs, No gallops or rubs. Respiratory: No rales, wheezing, rhonchi or rubs. GI: Soft, nondistended, mild tenteness in central abdomen, no rebound pain, no organomegaly, BS present. GU: No hematuria Ext: No pitting leg edema bilaterally. 2+DP/PT pulse bilaterally. Musculoskeletal: No joint deformities, No joint redness or warmth, no limitation of ROM in spin. Skin: No rashes.  Neuro: Alert, oriented X3, cranial nerves II-XII grossly intact, moves all extremities normally Psych: Patient is not psychotic, no suicidal or hemocidal ideation.  Labs on Admission: I have personally reviewed following labs and imaging studies  CBC: Recent Labs  Lab 05/01/20 0013 05/01/20 0630 05/01/20 1236  WBC 14.7* 13.9* 11.9*  HGB 10.5* 9.9* 11.0*  HCT 31.2* 30.3* 32.7*  MCV 91.2 92.9 91.1  PLT 323 316 305   Basic Metabolic Panel: Recent Labs  Lab 05/01/20 0013 05/01/20 0630  NA 136 136  K 4.1 3.4*  CL 98 97*  CO2 27 27  GLUCOSE 179* 125*  BUN 17 15  CREATININE 1.10 1.08  CALCIUM 9.2 9.2   GFR: Estimated Creatinine Clearance: 49.4 mL/min (by C-G formula based on SCr of 1.08 mg/dL). Liver Function Tests: Recent Labs  Lab 05/01/20 0013  AST 24  ALT 17  ALKPHOS 69  BILITOT 0.7  PROT 8.5*  ALBUMIN 3.9   Recent Labs  Lab 05/01/20 0013  LIPASE 24   No results for input(s): AMMONIA in the last 168 hours. Coagulation Profile: No results for input(s): INR, PROTIME in the last 168 hours. Cardiac Enzymes: No results for input(s): CKTOTAL, CKMB, CKMBINDEX, TROPONINI in the last 168 hours. BNP (last 3 results) No results for input(s): PROBNP in the last 8760 hours. HbA1C: No results for input(s): HGBA1C in the last 72 hours. CBG: No results for input(s): GLUCAP in the last 168 hours. Lipid Profile: No results for input(s): CHOL, HDL, LDLCALC, TRIG, CHOLHDL, LDLDIRECT in the last 72 hours. Thyroid Function  Tests: No results for input(s): TSH, T4TOTAL, FREET4, T3FREE, THYROIDAB in the last 72 hours. Anemia Panel: No results for input(s): VITAMINB12, FOLATE, FERRITIN, TIBC, IRON, RETICCTPCT in the last 72 hours. Urine analysis:    Component Value Date/Time   COLORURINE YELLOW (A) 11/12/2019 0446   APPEARANCEUR HAZY (A) 11/12/2019 0446   LABSPEC 1.020 11/12/2019 0446   PHURINE 5.0 11/12/2019 0446   GLUCOSEU NEGATIVE 11/12/2019 0446   HGBUR NEGATIVE 11/12/2019 0446   BILIRUBINUR NEGATIVE 11/12/2019 0446   KETONESUR 5 (A) 11/12/2019 0446   PROTEINUR NEGATIVE 11/12/2019 0446   NITRITE NEGATIVE 11/12/2019 0446   LEUKOCYTESUR NEGATIVE 11/12/2019 0446   Sepsis Labs: @LABRCNTIP (procalcitonin:4,lacticidven:4) )No results found for this or any previous visit (from the past 240 hour(s)).   Radiological Exams on Admission: DG Chest 2 View  Result Date: 05/01/2020 CLINICAL DATA:  Chest pain  with nausea and vomiting EXAM: CHEST - 2 VIEW COMPARISON:  None. FINDINGS: Cardiac shadow is within normal limits. The lungs are well aerated bilaterally. Left basilar infiltrate is seen in the lingula and lower lobe. No bony abnormality is noted. IMPRESSION: Multifocal left basilar infiltrate Electronically Signed   By: Alcide Clever M.D.   On: 05/01/2020 07:14   CT Angio Chest PE W and/or Wo Contrast  Result Date: 05/01/2020 CLINICAL DATA:  Chest pain and positive D-dimer EXAM: CT ANGIOGRAPHY CHEST WITH CONTRAST TECHNIQUE: Multidetector CT imaging of the chest was performed using the standard protocol during bolus administration of intravenous contrast. Multiplanar CT image reconstructions and MIPs were obtained to evaluate the vascular anatomy. CONTRAST:  37mL OMNIPAQUE IOHEXOL 350 MG/ML SOLN COMPARISON:  None. FINDINGS: Cardiovascular: There is a optimal opacification of the pulmonary arteries. There is no central,segmental, or subsegmental filling defects within the pulmonary arteries. The heart is normal in  size. No pericardial effusion or thickening. No evidence right heart strain. There is normal three-vessel brachiocephalic anatomy without proximal stenosis. The thoracic aorta is normal in appearance. Mediastinum/Nodes: No hilar, mediastinal, or axillary adenopathy. Thyroid gland, trachea, and esophagus demonstrate no significant findings. Lungs/Pleura: There is multifocal area of rounded airspace consolidation seen at the posterior right lung base with air bronchograms. Tree-in-bud opacities seen within the right middle lobe and anterior left upper lobe. Upper Abdomen: No acute abnormalities present in the visualized portions of the upper abdomen. Musculoskeletal: No chest wall abnormality. No acute or significant osseous findings. Review of the MIP images confirms the above findings. IMPRESSION: No central segmental or subsegmental pulmonary embolism. Multifocal areas airspace consolidation and tree-in-bud opacities, likely consistent with multifocal pneumonia. Electronically Signed   By: Jonna Clark M.D.   On: 05/01/2020 15:32     EKG: Independently reviewed.  Sinus rhythm, QTC 455, poor R wave progression, nonspecific T wave change   Assessment/Plan Principal Problem:   CAP (community acquired pneumonia) Active Problems:   Chest pain   Hypokalemia   GERD (gastroesophageal reflux disease)   Nausea vomiting and diarrhea   Acute metabolic encephalopathy   CAP (community acquired pneumonia): Patient has mild leukocytosis with WBC 11.9, no oxygen desaturation.  Chest x-ray showed multifocal left basilar infiltration.  CT angiogram was negative for central PE, but showed multifocal infiltration.  COVID-19 Ag is negative, pending COVID-19 PCR.  - Placed on MedSurg bed for observation - IV Rocephin and azithromycin - Mucinex for cough  - Bronchodilators - Urine legionella and S. pneumococcal antigen - Follow up blood culture x2, sputum culture  Chest pain: -continue home as needed  Norco  Hypokalemia: Potassium 3.4 -Repleted potassium -Check magnesium level  GERD (gastroesophageal reflux disease) -Protonix  Nausea vomiting and diarrhea: Unclear etiology, differential diagnosis includea food poisoning, viral gastroenteritis, pending COVID-19 PCR for ruling out Covid infection. Lipase 24.  Patient did not have diarrhea in the past 2 days. -As needed Zofran -IV fluid: Patient received 1 L normal saline bolus in the ED  Acute metabolic encephalopathy: Likely due to ongoing infection.  Altered mental status since resolved.  Currently patient is alert, oriented x3.  No focal neuro deficit on physical examination -Frequent neuro check     DVT ppx: SQ Lovenox Code Status: Full code Family Communication:    Yes, patient's daughter by phone Disposition Plan:  Anticipate discharge back to previous environment Consults called: None Admission status: Med-surg bed for obs     Status is: Observation  The patient remains OBS appropriate and  will d/c before 2 midnights.  Dispo: The patient is from: Home              Anticipated d/c is to: Home              Anticipated d/c date is: 1 day              Patient currently is not medically stable to d/c.          Date of Service 05/01/2020    Lorretta Harp [facility]   If 7PM-7AM, please contact night-coverage [email or web address] 05/01/2020, 4:05 PM

## 2020-05-01 NOTE — ED Notes (Signed)
Pt taken for CT 

## 2020-05-01 NOTE — ED Triage Notes (Signed)
Patient reports since leaving without being seen earlier he has developed chest pain.

## 2020-05-01 NOTE — ED Notes (Signed)
Repeat VS obtained by this RN. Pt visualized in NAD at this time. Pt asking about wait times, this RN apologized for and explained delay to patient at this time.

## 2020-05-01 NOTE — ED Provider Notes (Addendum)
[facility] Emergency Department Provider Note   ____________________________________________   First MD Initiated Contact with Patient 05/01/20 1217     (approximate)  I have reviewed the triage vital signs and the nursing notes.   HISTORY  Chief Complaint Chest Pain    HPI Drew Holt is a 64 y.o. male who confirms history and the nurses note.  He said Sunday he got some pork chops from his sister and he left it out on the table and ate it early in the morning the next morning.  On Monday he got no vomiting and some diarrhea that lasted for several days and began to improve then got worse again yesterday.  So he came into the hospital.  He waited for a while and then went home because the wait was so long and then in the middle of the night he got chest pain and tightness in the center of the chest.  He said he never had anything quite like that so he came into the hospital.  He is having some coughing also.         Past Medical History:  Diagnosis Date  . Neuropathy    Also reflux There are no problems to display for this patient.   No past surgical history on file.  Prior to Admission medications   Medication Sig Start Date End Date Taking? Authorizing Provider  HYDROcodone-acetaminophen (NORCO) 10-325 MG tablet Take 1 tablet by mouth every 4 (four) hours as needed. 04/28/20  Yes [provider]  ondansetron (ZOFRAN ODT) 4 MG disintegrating tablet Take 1 tablet (4 mg total) by mouth every 8 (eight) hours as needed. 03/29/20  Yes Jene Every, MD  pantoprazole (PROTONIX) 20 MG tablet Take 1 tablet (20 mg total) by mouth daily. 03/29/20 03/29/21 Yes Jene Every, MD  famotidine (PEPCID) 20 MG tablet Take 1 tablet (20 mg total) by mouth 2 (two) times daily. 02/21/19 03/29/20  Emily Filbert, MD    Allergies Patient has no known allergies.  No family history on file.  Social History Social History   Tobacco Use  . Smoking  status: Never Smoker  . Smokeless tobacco: Never Used  Substance Use Topics  . Alcohol use: No  . Drug use: Not on file    Review of Systems  Constitutional: No fever/chills Eyes: No visual changes. ENT: No sore throat. Cardiovascular:  chest pain. Respiratory: Denies shortness of breath. Gastrointestinal: Epigastric abdominal pain.   nausea, vomiting.   diarrhea.  No constipation. Genitourinary: Negative for dysuria. Musculoskeletal: Negative for back pain. Skin: Negative for rash. Neurological: Negative for headaches, focal weakness   ____________________________________________   PHYSICAL EXAM:  VITAL SIGNS: ED Triage Vitals [05/01/20 0624]  Enc Vitals Group     BP 119/83     Pulse Rate (!) 103     Resp 18     Temp 99 F (37.2 C)     Temp Source Oral     SpO2 96 %     Weight 109 lb 15.8 oz (49.9 kg)     Height 5\' 5"  (1.651 m)     Head Circumference      Peak Flow      Pain Score      Pain Loc      Pain Edu?      Excl. in GC?     Constitutional: Alert and oriented. Well appearing and in no acute distress. Eyes: Conjunctivae are normal. Head: Atraumatic. Nose: No congestion/rhinnorhea. Mouth/Throat: Mucous  membranes are moist.  Oropharynx non-erythematous. Neck: No stridor. Cardiovascular: Normal rate, regular rhythm. Grossly normal heart sounds.  Good peripheral circulation. Respiratory: Normal respiratory effort.  No retractions. Lungs CTAB.  Patient does have some pleuritic chest pain when he breathes Gastrointestinal: Soft some tenderness in the epigastrium only no distention. No abdominal bruits. No CVA tenderness. Musculoskeletal: No lower extremity tenderness nor edema.  No joint effusions. Neurologic:  Normal speech and language. No gross focal neurologic deficits are appreciated.  Skin:  Skin is warm, dry and intact. No rash noted.   ____________________________________________   LABS (all labs ordered are listed, but only abnormal results are  displayed)  Labs Reviewed  BASIC METABOLIC PANEL - Abnormal; Notable for the following components:      Result Value   Potassium 3.4 (*)    Chloride 97 (*)    Glucose, Bld 125 (*)    All other components within normal limits  CBC - Abnormal; Notable for the following components:   WBC 13.9 (*)    RBC 3.26 (*)    Hemoglobin 9.9 (*)    HCT 30.3 (*)    All other components within normal limits  CBC - Abnormal; Notable for the following components:   WBC 11.9 (*)    RBC 3.59 (*)    Hemoglobin 11.0 (*)    HCT 32.7 (*)    All other components within normal limits  FIBRIN DERIVATIVES D-DIMER ([facility] ONLY) - Abnormal; Notable for the following components:   Fibrin derivatives D-dimer ([facility]) 1,502.12 (*)    All other components within normal limits  POC SARS CORONAVIRUS 2 AG -  ED  POC SARS CORONAVIRUS 2 AG  TROPONIN I (HIGH SENSITIVITY)  TROPONIN I (HIGH SENSITIVITY)  TROPONIN I (HIGH SENSITIVITY)  TROPONIN I (HIGH SENSITIVITY)   ____________________________________________  EKG  EKG read interpreted by me shows normal sinus rhythm at 92 normal axis there is ST elevation in V4 only. EKG #2 shows sinus tachycardia at 102 normal axis no acute ST-T changes the ST elevation that was in lead V4 only has resolved.  ____________________________________________  RADIOLOGY  ED MD interpretation: Patient does have a patchy left basilar infiltrate.  Previous chest x-ray did not have that but CT immediately before the previous chest x-ray showed some densities in that area.  Official radiology report(s): DG Chest 2 View  Result Date: 05/01/2020 CLINICAL DATA:  Chest pain with nausea and vomiting EXAM: CHEST - 2 VIEW COMPARISON:  None. FINDINGS: Cardiac shadow is within normal limits. The lungs are well aerated bilaterally. Left basilar infiltrate is seen in the lingula and lower lobe. No bony abnormality is noted. IMPRESSION: Multifocal left basilar infiltrate Electronically Signed   By: Alcide Clever M.D.   On: 05/01/2020 07:14    ____________________________________________   PROCEDURES  Procedure(s) performed (including Critical Care):  Procedures   ____________________________________________   INITIAL IMPRESSION / ASSESSMENT AND PLAN / ED COURSE  Patient's thanks he likely has food poisoning.  He is probably right.  His white count is up a little bit.  The CBC done just after midnight showed an H&H of 10 and 31 and he had another one at 630 this morning which was somewhat lower at 9.930.  I will repeat it one more time just to make sure is not actually dropping further.  The difference between the first 2 could possibly just be variation due to lab error.    ----------------------------------------- 3:24 PM on 05/01/2020 -----------------------------------------  Patient is also cachectic  and may benefit from dietary consult.          ____________________________________________   FINAL CLINICAL IMPRESSION(S) / ED DIAGNOSES  Final diagnoses:  Community acquired pneumonia, unspecified laterality   Actual diagnosis is non-Covid trilobar pneumonia but of course this does not exist in the computer  ED Discharge Orders    None       Note:  This document was prepared using Dragon voice recognition software and may include unintentional dictation errors.    Arnaldo Natal, MD 05/01/20 1521    Arnaldo Natal, MD 05/01/20 1524 Hospitalist notes that he got the antigen test not the PCR test so we will get the PCR test for his Covid the meantime I will start him on regular antibiotics just in case.   Arnaldo Natal, MD 05/01/20 1531

## 2020-05-02 DIAGNOSIS — R112 Nausea with vomiting, unspecified: Secondary | ICD-10-CM | POA: Diagnosis not present

## 2020-05-02 DIAGNOSIS — R197 Diarrhea, unspecified: Secondary | ICD-10-CM | POA: Diagnosis not present

## 2020-05-02 DIAGNOSIS — J189 Pneumonia, unspecified organism: Secondary | ICD-10-CM | POA: Diagnosis not present

## 2020-05-02 LAB — CBC WITH DIFFERENTIAL/PLATELET
Abs Immature Granulocytes: 0.03 10*3/uL (ref 0.00–0.07)
Basophils Absolute: 0 10*3/uL (ref 0.0–0.1)
Basophils Relative: 1 %
Eosinophils Absolute: 0 10*3/uL (ref 0.0–0.5)
Eosinophils Relative: 0 %
HCT: 31.3 % — ABNORMAL LOW (ref 39.0–52.0)
Hemoglobin: 10.2 g/dL — ABNORMAL LOW (ref 13.0–17.0)
Immature Granulocytes: 0 %
Lymphocytes Relative: 24 %
Lymphs Abs: 2 10*3/uL (ref 0.7–4.0)
MCH: 30.5 pg (ref 26.0–34.0)
MCHC: 32.6 g/dL (ref 30.0–36.0)
MCV: 93.7 fL (ref 80.0–100.0)
Monocytes Absolute: 0.7 10*3/uL (ref 0.1–1.0)
Monocytes Relative: 9 %
Neutro Abs: 5.3 10*3/uL (ref 1.7–7.7)
Neutrophils Relative %: 66 %
Platelets: 303 10*3/uL (ref 150–400)
RBC: 3.34 MIL/uL — ABNORMAL LOW (ref 4.22–5.81)
RDW: 12.5 % (ref 11.5–15.5)
WBC: 8.1 10*3/uL (ref 4.0–10.5)
nRBC: 0 % (ref 0.0–0.2)

## 2020-05-02 LAB — COMPREHENSIVE METABOLIC PANEL
ALT: 18 U/L (ref 0–44)
AST: 21 U/L (ref 15–41)
Albumin: 3.5 g/dL (ref 3.5–5.0)
Alkaline Phosphatase: 58 U/L (ref 38–126)
Anion gap: 11 (ref 5–15)
BUN: 10 mg/dL (ref 8–23)
CO2: 28 mmol/L (ref 22–32)
Calcium: 9.4 mg/dL (ref 8.9–10.3)
Chloride: 103 mmol/L (ref 98–111)
Creatinine, Ser: 0.91 mg/dL (ref 0.61–1.24)
GFR calc Af Amer: >60 mL/min (ref >60)
GFR calc non Af Amer: >60 mL/min (ref >60)
Glucose, Bld: 86 mg/dL (ref 70–99)
Potassium: 4.2 mmol/L (ref 3.5–5.1)
Sodium: 142 mmol/L (ref 135–145)
Total Bilirubin: 0.6 mg/dL (ref 0.3–1.2)
Total Protein: 7.6 g/dL (ref 6.5–8.1)

## 2020-05-02 LAB — MAGNESIUM: Magnesium: 2.1 mg/dL (ref 1.7–2.4)

## 2020-05-02 LAB — PROCALCITONIN: Procalcitonin: 0.24 ng/mL

## 2020-05-02 MED ORDER — AZITHROMYCIN 500 MG PO TABS: 500.0000 mg | ORAL_TABLET | Freq: Every day | ORAL | 0 refills | Status: AC

## 2020-05-02 MED ORDER — CEFDINIR 300 MG PO CAPS
300.0000 mg | ORAL_CAPSULE | Freq: Two times a day (BID) | ORAL | 0 refills | Status: AC
Start: 2020-05-02 — End: 2020-05-07

## 2020-05-02 NOTE — Progress Notes (Signed)
Drew Holt  A and O x 4 VSS. Pt tolerating diet well. No complaints of pain or nausea. IV removed intact, prescriptions given. Pt voices understanding of discharge instructions with no further questions. Pt discharged via wheelchair with axillary.   Allergies as of 05/02/2020   No Known Allergies     Medication List    TAKE these medications   azithromycin 500 MG tablet Commonly known as: Zithromax Take 1 tablet (500 mg total) by mouth daily for 3 days. Take 1 tablet daily for 3 days.   cefdinir 300 MG capsule Commonly known as: OMNICEF Take 1 capsule (300 mg total) by mouth 2 (two) times daily for 5 days.   HYDROcodone-acetaminophen 10-325 MG tablet Commonly known as: NORCO Take 1 tablet by mouth every 4 (four) hours as needed.   ondansetron 4 MG disintegrating tablet Commonly known as: Zofran ODT Take 1 tablet (4 mg total) by mouth every 8 (eight) hours as needed.   pantoprazole 20 MG tablet Commonly known as: Protonix Take 1 tablet (20 mg total) by mouth daily.       Vitals:   05/02/20 0019 05/02/20 0445  BP: 111/73 110/62  Pulse: 75 66  Resp: 20 18  Temp: 98.3 F (36.8 C) 97.9 F (36.6 C)  SpO2: 100% 96%    Drew Holt Drew Holt

## 2020-05-02 NOTE — Discharge Summary (Signed)
Physician Discharge Summary  Patient ID: Drew Holt MRN: [id number] DOB/AGE: 1956-06-15 64 y.o.  Admit date: 05/01/2020 Discharge date: 05/02/2020  Admission Diagnoses:  Discharge Diagnoses:  Principal Problem:   CAP (community acquired pneumonia) Active Problems:   Chest pain   Hypokalemia   GERD (gastroesophageal reflux disease)   Nausea vomiting and diarrhea   Acute metabolic encephalopathy   Discharged Condition: good  Hospital Course:   Drew Holt is a 64 y.o. male with medical history significant of GERD, neuropathy, who presents with nausea, vomiting, diarrhea, chest pain and cough.  Per his daughter, patient developed nausea, vomiting, some diarrhea and mild abdominal bloating discomfort on Monday after he ate some leftover food.  His GI symptoms have improved already.  He did not have diarrhea in the past 2 days.  He suspects that he has food poisoning. Daughter reported that pt was confused yesterday, currently patient is alert, oriented x3. No unilateral numbness or tingling to extremities, no facial droop or slurred speech. Patient has dry cough and mild pleuritic chest pain.  Denies shortness of breath.  Chest x-ray showed multifocal infiltration in the left basilar field.  CT angiogram was negative for central PE, but showed multifocal infiltration. Patient was treated with IV antibiotics with Rocephin and Zithromax. Currently he is asymptomatic. Procalcitonin level 0.24. Pneumonia probably is viral. I will continue a few days antibiotics with Zithromax and cefdinir.  Patient has been advised to follow-up with his family doctor in the near future within a week.    Consults: None  Significant Diagnostic Studies:  CT ANGIOGRAPHY CHEST WITH CONTRAST  TECHNIQUE: Multidetector CT imaging of the chest was performed using the standard protocol during bolus administration of intravenous contrast. Multiplanar CT image reconstructions and MIPs were obtained to evaluate  the vascular anatomy.  CONTRAST:  38mL OMNIPAQUE IOHEXOL 350 MG/ML SOLN  COMPARISON:  None.  FINDINGS: Cardiovascular: There is a optimal opacification of the pulmonary arteries. There is no central,segmental, or subsegmental filling defects within the pulmonary arteries. The heart is normal in size. No pericardial effusion or thickening. No evidence right heart strain. There is normal three-vessel brachiocephalic anatomy without proximal stenosis. The thoracic aorta is normal in appearance.  Mediastinum/Nodes: No hilar, mediastinal, or axillary adenopathy. Thyroid gland, trachea, and esophagus demonstrate no significant findings.  Lungs/Pleura: There is multifocal area of rounded airspace consolidation seen at the posterior right lung base with air bronchograms. Tree-in-bud opacities seen within the right middle lobe and anterior left upper lobe.  Upper Abdomen: No acute abnormalities present in the visualized portions of the upper abdomen.  Musculoskeletal: No chest wall abnormality. No acute or significant osseous findings.  Review of the MIP images confirms the above findings.  IMPRESSION: No central segmental or subsegmental pulmonary embolism. Multifocal areas airspace consolidation and tree-in-bud opacities, likely consistent with multifocal pneumonia.   Electronically Signed   By: Jonna Clark M.D.   On: 05/01/2020 15:32    Treatments: Antibiotics with Rocephin and Zithromax.  Discharge Exam: Blood pressure 110/62, pulse 66, temperature 97.9 F (36.6 C), temperature source Oral, resp. rate 18, height 5\' 5"  (1.651 m), weight 49.9 kg, SpO2 96 %. General appearance: alert and cooperative Resp: clear to auscultation bilaterally Cardio: regular rate and rhythm, S1, S2 normal, no murmur, click, rub or gallop GI: soft, non-tender; bowel sounds normal; no masses,  no organomegaly Extremities: extremities normal, atraumatic, no cyanosis or  edema  Disposition: Discharge disposition: 01-Home or Self Care       Discharge Instructions  Diet - low sodium heart healthy   Complete by: As directed    Increase activity slowly   Complete by: As directed      Allergies as of 05/02/2020   No Known Allergies     Medication List    TAKE these medications   azithromycin 500 MG tablet Commonly known as: Zithromax Take 1 tablet (500 mg total) by mouth daily for 3 days. Take 1 tablet daily for 3 days.   cefdinir 300 MG capsule Commonly known as: OMNICEF Take 1 capsule (300 mg total) by mouth 2 (two) times daily for 5 days.   HYDROcodone-acetaminophen 10-325 MG tablet Commonly known as: NORCO Take 1 tablet by mouth every 4 (four) hours as needed.   ondansetron 4 MG disintegrating tablet Commonly known as: Zofran ODT Take 1 tablet (4 mg total) by mouth every 8 (eight) hours as needed.   pantoprazole 20 MG tablet Commonly known as: Protonix Take 1 tablet (20 mg total) by mouth daily.        Signed: Marrion Coy 05/02/2020, 10:31 AM

## 2020-05-02 NOTE — Discharge Instructions (Signed)
Follow up with PCP in 1 week time.

## 2020-05-06 LAB — CULTURE, BLOOD (ROUTINE X 2)
Culture: NO GROWTH
Culture: NO GROWTH
Special Requests: ADEQUATE
Special Requests: ADEQUATE

## 2020-11-29 ENCOUNTER — Other Ambulatory Visit: Payer: Self-pay

## 2020-11-29 ENCOUNTER — Inpatient Hospital Stay
Admission: EM | Admit: 2020-11-29 | Discharge: 2020-12-01 | DRG: 871 | Disposition: A | Discharge status: Home / Self Care | Payer: Medicare HMO | Attending: Family Medicine | Admitting: Family Medicine

## 2020-11-29 ENCOUNTER — Emergency Department: Payer: Medicare HMO

## 2020-11-29 ENCOUNTER — Encounter: Payer: Self-pay | Admitting: Internal Medicine

## 2020-11-29 DIAGNOSIS — J189 Pneumonia, unspecified organism: Secondary | ICD-10-CM | POA: Diagnosis not present

## 2020-11-29 DIAGNOSIS — Z79899 Other long term (current) drug therapy: Secondary | ICD-10-CM

## 2020-11-29 DIAGNOSIS — J470 Bronchiectasis with acute lower respiratory infection: Secondary | ICD-10-CM | POA: Diagnosis present

## 2020-11-29 DIAGNOSIS — G629 Polyneuropathy, unspecified: Secondary | ICD-10-CM | POA: Diagnosis present

## 2020-11-29 DIAGNOSIS — R112 Nausea with vomiting, unspecified: Secondary | ICD-10-CM | POA: Diagnosis not present

## 2020-11-29 DIAGNOSIS — K5900 Constipation, unspecified: Secondary | ICD-10-CM | POA: Diagnosis present

## 2020-11-29 DIAGNOSIS — R197 Diarrhea, unspecified: Secondary | ICD-10-CM

## 2020-11-29 DIAGNOSIS — Z681 Body mass index (BMI) 19 or less, adult: Secondary | ICD-10-CM

## 2020-11-29 DIAGNOSIS — A419 Sepsis, unspecified organism: Principal | ICD-10-CM | POA: Diagnosis present

## 2020-11-29 DIAGNOSIS — K219 Gastro-esophageal reflux disease without esophagitis: Secondary | ICD-10-CM | POA: Diagnosis present

## 2020-11-29 DIAGNOSIS — Z20822 Contact with and (suspected) exposure to covid-19: Secondary | ICD-10-CM | POA: Diagnosis present

## 2020-11-29 DIAGNOSIS — D649 Anemia, unspecified: Secondary | ICD-10-CM | POA: Diagnosis present

## 2020-11-29 DIAGNOSIS — Z66 Do not resuscitate: Secondary | ICD-10-CM | POA: Diagnosis present

## 2020-11-29 DIAGNOSIS — R634 Abnormal weight loss: Secondary | ICD-10-CM | POA: Diagnosis present

## 2020-11-29 DIAGNOSIS — J188 Other pneumonia, unspecified organism: Secondary | ICD-10-CM | POA: Diagnosis present

## 2020-11-29 DIAGNOSIS — G894 Chronic pain syndrome: Secondary | ICD-10-CM | POA: Diagnosis present

## 2020-11-29 DIAGNOSIS — Z833 Family history of diabetes mellitus: Secondary | ICD-10-CM

## 2020-11-29 LAB — COMPREHENSIVE METABOLIC PANEL
ALT: 18 U/L (ref 0–44)
AST: 29 U/L (ref 15–41)
Albumin: 4.3 g/dL (ref 3.5–5.0)
Alkaline Phosphatase: 61 U/L (ref 38–126)
Anion gap: 9 (ref 5–15)
BUN: 17 mg/dL (ref 8–23)
CO2: 26 mmol/L (ref 22–32)
Calcium: 9.1 mg/dL (ref 8.9–10.3)
Chloride: 106 mmol/L (ref 98–111)
Creatinine, Ser: 0.78 mg/dL (ref 0.61–1.24)
GFR, Estimated: >60 mL/min (ref >60)
Glucose, Bld: 137 mg/dL — ABNORMAL HIGH (ref 70–99)
Potassium: 4.1 mmol/L (ref 3.5–5.1)
Sodium: 141 mmol/L (ref 135–145)
Total Bilirubin: 0.7 mg/dL (ref 0.3–1.2)
Total Protein: 8.4 g/dL — ABNORMAL HIGH (ref 6.5–8.1)

## 2020-11-29 LAB — CBC
HCT: 35.1 % — ABNORMAL LOW (ref 39.0–52.0)
Hemoglobin: 11.1 g/dL — ABNORMAL LOW (ref 13.0–17.0)
MCH: 29.9 pg (ref 26.0–34.0)
MCHC: 31.6 g/dL (ref 30.0–36.0)
MCV: 94.6 fL (ref 80.0–100.0)
Platelets: 279 10*3/uL (ref 150–400)
RBC: 3.71 MIL/uL — ABNORMAL LOW (ref 4.22–5.81)
RDW: 13.2 % (ref 11.5–15.5)
WBC: 20.4 10*3/uL — ABNORMAL HIGH (ref 4.0–10.5)
nRBC: 0 % (ref 0.0–0.2)

## 2020-11-29 LAB — URINALYSIS, COMPLETE (UACMP) WITH MICROSCOPIC
Bacteria, UA: NONE SEEN
Bilirubin Urine: NEGATIVE
Glucose, UA: NEGATIVE mg/dL
Ketones, ur: 5 mg/dL — AB
Leukocytes,Ua: NEGATIVE
Nitrite: NEGATIVE
Protein, ur: NEGATIVE mg/dL
Specific Gravity, Urine: 1.036 — ABNORMAL HIGH (ref 1.005–1.030)
Squamous Epithelial / HPF: NONE SEEN (ref 0–5)
pH: 5 (ref 5.0–8.0)

## 2020-11-29 LAB — TSH: TSH: 1.82 u[IU]/mL (ref 0.350–4.500)

## 2020-11-29 LAB — SARS CORONAVIRUS 2 (TAT 6-24 HRS): SARS Coronavirus 2: NEGATIVE

## 2020-11-29 LAB — LACTIC ACID, PLASMA: Lactic Acid, Venous: 1.3 mmol/L (ref 0.5–1.9)

## 2020-11-29 LAB — LIPASE, BLOOD: Lipase: 30 U/L (ref 11–51)

## 2020-11-29 MED ORDER — SODIUM CHLORIDE 0.9 % IV SOLN
1000.0000 mL | Freq: Once | INTRAVENOUS | Status: AC
  Administered 2020-11-29: 1000 mL via INTRAVENOUS

## 2020-11-29 MED ORDER — SENNOSIDES-DOCUSATE SODIUM 8.6-50 MG PO TABS: 2.0000 | ORAL_TABLET | Freq: Every day | ORAL | Status: DC | PRN

## 2020-11-29 MED ORDER — MORPHINE SULFATE (PF) 4 MG/ML IV SOLN
INTRAVENOUS | Status: AC
  Administered 2020-11-29: 4 mg via INTRAVENOUS
  Filled 2020-11-29: qty 1

## 2020-11-29 MED ORDER — IOHEXOL 300 MG/ML  SOLN
75.0000 mL | Freq: Once | INTRAMUSCULAR | Status: AC | PRN
  Administered 2020-11-29: 75 mL via INTRAVENOUS

## 2020-11-29 MED ORDER — MORPHINE SULFATE (PF) 4 MG/ML IV SOLN: 4.0000 mg | Freq: Once | INTRAVENOUS | Status: AC

## 2020-11-29 MED ORDER — ACETAMINOPHEN 325 MG PO TABS
325.0000 mg | ORAL_TABLET | Freq: Four times a day (QID) | ORAL | Status: DC | PRN
  Filled 2020-11-29: qty 1

## 2020-11-29 MED ORDER — ACETAMINOPHEN 650 MG RE SUPP: 325.0000 mg | Freq: Four times a day (QID) | RECTAL | Status: DC | PRN

## 2020-11-29 MED ORDER — POLYETHYLENE GLYCOL 3350 17 G PO PACK
17.0000 g | PACK | Freq: Two times a day (BID) | ORAL | Status: DC
  Administered 2020-11-29 – 2020-11-30 (×3): 17 g via ORAL
  Filled 2020-11-29 (×4): qty 1

## 2020-11-29 MED ORDER — ENOXAPARIN SODIUM 30 MG/0.3ML ~~LOC~~ SOLN
30.0000 mg | SUBCUTANEOUS | Status: DC
  Administered 2020-11-29: 21:00:00 30 mg via SUBCUTANEOUS
  Filled 2020-11-29 (×3): qty 0.3

## 2020-11-29 MED ORDER — ONDANSETRON HCL 4 MG/2ML IJ SOLN: 4.0000 mg | Freq: Once | INTRAMUSCULAR | Status: AC

## 2020-11-29 MED ORDER — FAMOTIDINE 20 MG PO TABS
20.0000 mg | ORAL_TABLET | Freq: Two times a day (BID) | ORAL | Status: DC | PRN
  Administered 2020-11-29: 20 mg via ORAL
  Filled 2020-11-29: qty 1

## 2020-11-29 MED ORDER — SODIUM CHLORIDE 0.9 % IV SOLN
1.0000 g | INTRAVENOUS | Status: DC
  Administered 2020-11-30 – 2020-12-01 (×2): 1 g via INTRAVENOUS
  Filled 2020-11-29 (×2): qty 1

## 2020-11-29 MED ORDER — SODIUM CHLORIDE 0.9 % IV BOLUS
500.0000 mL | Freq: Once | INTRAVENOUS | Status: AC
  Administered 2020-11-29: 500 mL via INTRAVENOUS

## 2020-11-29 MED ORDER — ONDANSETRON HCL 4 MG/2ML IJ SOLN
INTRAMUSCULAR | Status: AC
  Administered 2020-11-29: 4 mg via INTRAVENOUS
  Filled 2020-11-29: qty 2

## 2020-11-29 MED ORDER — SENNOSIDES-DOCUSATE SODIUM 8.6-50 MG PO TABS
2.0000 | ORAL_TABLET | Freq: Two times a day (BID) | ORAL | Status: DC
  Administered 2020-11-29 – 2020-11-30 (×3): 2 via ORAL
  Filled 2020-11-29 (×4): qty 2

## 2020-11-29 MED ORDER — ONDANSETRON HCL 4 MG PO TABS
4.0000 mg | ORAL_TABLET | Freq: Four times a day (QID) | ORAL | Status: AC | PRN
  Administered 2020-11-29: 4 mg via ORAL
  Filled 2020-11-29: qty 1

## 2020-11-29 MED ORDER — ONDANSETRON HCL 4 MG/2ML IJ SOLN
4.0000 mg | Freq: Once | INTRAMUSCULAR | Status: AC | PRN
  Administered 2020-11-29: 4 mg via INTRAVENOUS
  Filled 2020-11-29: qty 2

## 2020-11-29 MED ORDER — SODIUM CHLORIDE 0.9 % IV SOLN
500.0000 mg | INTRAVENOUS | Status: DC
  Filled 2020-11-29: qty 500

## 2020-11-29 MED ORDER — ONDANSETRON HCL 4 MG/2ML IJ SOLN
4.0000 mg | Freq: Four times a day (QID) | INTRAMUSCULAR | Status: AC | PRN
  Administered 2020-11-30 – 2020-12-01 (×2): 4 mg via INTRAVENOUS
  Filled 2020-11-29 (×2): qty 2

## 2020-11-29 MED ORDER — SODIUM CHLORIDE 0.9 % IV SOLN
500.0000 mg | INTRAVENOUS | Status: DC
  Administered 2020-11-29 – 2020-12-01 (×3): 500 mg via INTRAVENOUS
  Filled 2020-11-29 (×3): qty 500

## 2020-11-29 MED ORDER — MORPHINE SULFATE (PF) 2 MG/ML IV SOLN: 0.5000 mg | INTRAVENOUS | Status: DC | PRN

## 2020-11-29 MED ORDER — KETOROLAC TROMETHAMINE 15 MG/ML IJ SOLN
15.0000 mg | Freq: Three times a day (TID) | INTRAMUSCULAR | Status: AC | PRN
  Administered 2020-11-29 – 2020-11-30 (×3): 15 mg via INTRAVENOUS
  Filled 2020-11-29 (×5): qty 1

## 2020-11-29 MED ORDER — SODIUM CHLORIDE 0.9 % IV SOLN
2.0000 g | INTRAVENOUS | Status: DC
  Administered 2020-11-29: 2 g via INTRAVENOUS
  Filled 2020-11-29 (×2): qty 20

## 2020-11-29 NOTE — ED Notes (Signed)
Informed RN bed assigned 

## 2020-11-29 NOTE — Progress Notes (Signed)
PHARMACIST - PHYSICIAN COMMUNICATION  CONCERNING:  Enoxaparin (Lovenox) for DVT Prophylaxis    RECOMMENDATION: Patient was prescribed enoxaparin 40mg  q24 hours for VTE prophylaxis.   Filed Weights   11/29/20 0937  Weight: 45.4 kg (100 lb)    Body mass index is 17.16 kg/m.  Estimated Creatinine Clearance: 59.9 mL/min (by C-G formula based on SCr of 0.78 mg/dL).   Patient is candidate for enoxaparin 30mg  every 24 hours based on CrCl <23ml/min or Weight <45kg  DESCRIPTION: Pharmacy has adjusted enoxaparin dose per [facility] policy.  Patient is now receiving enoxaparin 30 mg every 24 hours    31m 11/29/2020 2:52 PM

## 2020-11-29 NOTE — ED Notes (Signed)
Pt requesting pain meds, This RN brought pt tylenol and pt states he takes 6 vicoden a day and does not want tylenol.

## 2020-11-29 NOTE — Sepsis Progress Note (Signed)
Notified provider of need to order lactic acid. ° °

## 2020-11-29 NOTE — Sepsis Progress Note (Signed)
elink monitoring code sepsis.  

## 2020-11-29 NOTE — ED Provider Notes (Signed)
[facility] ([place]) Emergency Department Provider Note   ____________________________________________    I have reviewed the triage vital signs and the nursing notes.   HISTORY  Chief Complaint Abdominal Pain and Emesis     HPI Drew Holt is a 65 y.o. male who presents with abdominal pain, nausea and vomiting.  Patient reports that his started overnight.  He has had this before.  Describes primarily epigastric cramping sensation.  Denies alcohol or smoking.  No history of pancreatitis.  Reviewed records and straight the patient is had this and has been treated for it several times in our emergency department.  No fevers or chills.  No hematemesis  Past Medical History:  Diagnosis Date  . Neuropathy     Patient Active Problem List   Diagnosis Date Noted  . Chest pain 05/01/2020  . CAP (community acquired pneumonia) 05/01/2020  . Hypokalemia 05/01/2020  . GERD (gastroesophageal reflux disease) 05/01/2020  . Nausea vomiting and diarrhea 05/01/2020  . Acute metabolic encephalopathy 05/01/2020    Past Surgical History:  Procedure Laterality Date  . HEMORRHOID SURGERY      Prior to Admission medications   Medication Sig Start Date End Date Taking? Authorizing Provider  HYDROcodone-acetaminophen (NORCO) 10-325 MG tablet Take 1 tablet by mouth every 4 (four) hours as needed. 04/28/20   [provider]  ondansetron (ZOFRAN ODT) 4 MG disintegrating tablet Take 1 tablet (4 mg total) by mouth every 8 (eight) hours as needed. 03/29/20   Jene Every, MD  pantoprazole (PROTONIX) 20 MG tablet Take 1 tablet (20 mg total) by mouth daily. 03/29/20 03/29/21  Jene Every, MD  famotidine (PEPCID) 20 MG tablet Take 1 tablet (20 mg total) by mouth 2 (two) times daily. 02/21/19 03/29/20  Emily Filbert, MD     Allergies Patient has no known allergies.  Family History  Problem Relation Age of Onset  . Diabetes Mellitus II Sister   . Liver disease Sister      Social History Social History   Tobacco Use  . Smoking status: Never Smoker  . Smokeless tobacco: Never Used  Substance Use Topics  . Alcohol use: No  . Drug use: Never    Review of Systems  Constitutional: No fever/chills Eyes: No visual changes.  ENT: No sore throat. Cardiovascular: Denies chest pain. Respiratory: Denies shortness of breath. Gastrointestinal: As above Genitourinary: Negative for dysuria. Musculoskeletal: Negative for back pain. Skin: Negative for rash. Neurological: Negative for headaches    ____________________________________________   PHYSICAL EXAM:  VITAL SIGNS: ED Triage Vitals  Enc Vitals Group     BP 11/29/20 0933 121/89     Pulse Rate 11/29/20 0933 (!) 117     Resp 11/29/20 0933 20     Temp 11/29/20 0933 97.9 F (36.6 C)     Temp Source 11/29/20 0933 Axillary     SpO2 11/29/20 0933 100 %     Weight 11/29/20 0937 45.4 kg (100 lb)     Height 11/29/20 0937 1.626 m (5\' 4" )     Head Circumference --      Peak Flow --      Pain Score 11/29/20 0936 10     Pain Loc --      Pain Edu? --      Excl. in GC? --     Constitutional: Alert and oriented.  Nose: No congestion/rhinnorhea. Mouth/Throat: Mucous membranes are moist.    Cardiovascular: Normal rate, regular rhythm. Grossly normal heart sounds.  Good peripheral  circulation. Respiratory: Normal respiratory effort.  No retractions.  Bibasilar Rales Gastrointestinal: Soft and nontender. No distention.  No CVA tenderness.  Musculoskeletal: No lower extremity tenderness nor edema.  Warm and well perfused Neurologic:  Normal speech and language. No gross focal neurologic deficits are appreciated.  Skin:  Skin is warm, dry and intact. No rash noted. Psychiatric: Mood and affect are normal. Speech and behavior are normal.  ____________________________________________   LABS (all labs ordered are listed, but only abnormal results are displayed)  Labs Reviewed  COMPREHENSIVE  METABOLIC PANEL - Abnormal; Notable for the following components:      Result Value   Glucose, Bld 137 (*)    Total Protein 8.4 (*)    All other components within normal limits  CBC - Abnormal; Notable for the following components:   WBC 20.4 (*)    RBC 3.71 (*)    Hemoglobin 11.1 (*)    HCT 35.1 (*)    All other components within normal limits  CULTURE, BLOOD (SINGLE)  LIPASE, BLOOD  URINALYSIS, COMPLETE (UACMP) WITH MICROSCOPIC  LACTIC ACID, PLASMA  LACTIC ACID, PLASMA   ____________________________________________  EKG  None ____________________________________________  RADIOLOGY  CT abdomen pelvis Chest x-ray ____________________________________________   PROCEDURES  Procedure(s) performed: No  Procedures   Critical Care performed: No ____________________________________________   INITIAL IMPRESSION / ASSESSMENT AND PLAN / ED COURSE  Pertinent labs & imaging results that were available during my care of the patient were reviewed by me and considered in my medical decision making (see chart for details).  Patient presents with nausea vomiting primarily epigastric discomfort, will treat with IV fluids, IV Zofran, IV morphine.  This seems to be in keeping with episodes that he has had before labs pending  Lab work notable for elevated white blood cell count of 20,000, will obtain CT abdomen pelvis  CT negative for acute abnormality, question infiltrate in lower lungs, although the patient has a history of bronchiectasis, this may be chronic.  Will obtain chest x-ray and draw lactic and blood culture.  Chest x-ray redemonstrates likely infiltrate left lower lungs, patient is being treated with IV antibiotics will require admission for further treatment given elevated white blood cell count, tachycardia and pneumonia    ____________________________________________   FINAL CLINICAL IMPRESSION(S) / ED DIAGNOSES  Final diagnoses:  Sepsis without acute organ  dysfunction, due to unspecified organism [facility])        Note:  This document was prepared using Dragon voice recognition software and may include unintentional dictation errors.   Jene Every, MD 11/29/20 1302

## 2020-11-29 NOTE — ED Notes (Signed)
Patient transported to CT 

## 2020-11-29 NOTE — H&P (Signed)
History and Physical   Drew Holt WLS:[id number] DOB: [birth year] DOA: 11/29/2020  PCP: Dr. Etheleen Nicks Patient coming from: home  I have personally briefly reviewed patient's old medical records in [facility] EMR.  Chief Concern: Nausea and vomiting  HPI: Drew Holt is a 65 y.o. male with medical history significant for diffuse axonal neuropathy, chronic pain syndrome, rash and nonspecific skin eruptions, ED, presents to the emergency department for chief concerns of nausea and vomiting and shortness of breath.  Mr. Wilson Singer reports that the symptoms started at approximately 5 AM on 11/28/20.specifically he endorses wheezing, stomach pain, nausea, vomiting.  He vomited food from last evening initially and now he has yellow foam liquid.   He endorses chills and subjective fevers, and denies diarrhea.  He endorses unintentional 20 pound weight, in 6 months.   Social history: lives by himself, no tobacco, etoh, and recreational drug use Allergies: no known life threatening drug allergies Vaccinations: COVID-19 vaccinations with Pfizer 12/03/2019, 12/24/2019, 05/25/2020  ROS: Constitutional: no weight change, no fever ENT/Mouth: no sore throat, no rhinorrhea Eyes: no eye pain, no vision changes Cardiovascular: no chest pain, + dyspnea,  no edema, no palpitations Respiratory: no cough, no sputum, no wheezing Gastrointestinal: + nausea, + vomiting, no diarrhea, no constipation Genitourinary: no urinary incontinence, no dysuria, no hematuria Musculoskeletal: no arthralgias, no myalgias Skin: no skin lesions, no pruritus, Neuro: + weakness, no loss of consciousness, no syncope Psych: no anxiety, no depression, + decrease appetite Heme/Lymph: no bruising, no bleeding  ED Course: Discussed with ED provider, patient requiring hospitalization for intractable nausea and vomiting and pneumonia.  Dose in the ED was remarkable for afebrile with temperature of 97.9, respiration rate of 19-20, heart  rate of 87-1 17, blood pressure now 101/74, satting at 100% on room air.  He is status post morphine 4 mg IV per ED provider.  ED provider gave patient normal saline 1 L bolus, ceftriaxone, azithromycin, ondansetron 4 mg IV once.  Labs in the ED was remarkable for nonfasting glucose of 137, WBC of 20.4, hemoglobin of 11.1, platelets 279. CT of the abdomen and pelvis with contrast was read as large stool burden throughout the colon, no evidence of bowel obstruction, aortic atherosclerosis.  Left lower lobe bronchiectasis with worsening airspace disease.  Assessment/Plan  Principal Problem:   CAP (community acquired pneumonia) Active Problems:   GERD (gastroesophageal reflux disease)   Nausea vomiting and diarrhea   Intractable vomiting with nausea   Pneumonia Meet sepsis criteria, sinus tachycardia, leukocytosis with WBC of 20.4, right lower lobe pneumonia -Lactic was negative x1 -Single blood culture ordered by ED provider -as patient meets sepsis criteria, a second blood culture has been placed -Status post ceftriaxone and azithromycin per ED provider -Second blood culture ordered -EKG ordered to assess current QTC -No QTC on EKG, continue azithromycin and ceftriaxone  Abdominal pain-suspect secondary to constipation -Acetaminophen 325 mg as needed for mild pain -Toradol 15 mcg injection every 8 hours as needed for moderate pain -Chronic opioid use -Resumed home Senokot 2 tablets -GlycoLax scheduled twice daily including now  GERD-famotidine 20 mg nightly for heartburn  Weight loss-unintentional 20 pound  -Outpatient follow-up -Checking TSH  Covid PCR is pending  Chart reviewed.   DVT prophylaxis: Enoxaparin Code Status: DNR Diet: Heart healthy Family Communication: None Disposition Plan: Pending clinical course Consults called: None at this time Admission status: Observation, MedSurg, with telemetry  Past Medical History:  Diagnosis Date  . Neuropathy    Past  Surgical History:  Procedure Laterality Date  . HEMORRHOID SURGERY     Social History:  reports that he has never smoked. He has never used smokeless tobacco. He reports that he does not drink alcohol and does not use drugs.  No Known Allergies Family History  Problem Relation Age of Onset  . Diabetes Mellitus II Sister   . Liver disease Sister    Family history: Family history reviewed and not pertinent  Prior to Admission medications   Medication Sig Start Date End Date Taking? Authorizing Provider  HYDROcodone-acetaminophen (NORCO) 10-325 MG tablet Take 1 tablet by mouth every 4 (four) hours as needed. 04/28/20   [provider]  ondansetron (ZOFRAN ODT) 4 MG disintegrating tablet Take 1 tablet (4 mg total) by mouth every 8 (eight) hours as needed. 03/29/20   Jene Every, MD  pantoprazole (PROTONIX) 20 MG tablet Take 1 tablet (20 mg total) by mouth daily. 03/29/20 03/29/21  Jene Every, MD  famotidine (PEPCID) 20 MG tablet Take 1 tablet (20 mg total) by mouth 2 (two) times daily. 02/21/19 03/29/20  Emily Filbert, MD   Physical Exam: Vitals:   11/29/20 1100 11/29/20 1205 11/29/20 1230 11/29/20 1330  BP: 129/88 127/83 (!) 158/80 107/89  Pulse: (!) 110 (!) 105 (!) 105 95  Resp: (!) 119 18 19   Temp:      TempSrc:      SpO2: 97% 99% 98% 96%  Weight:      Height:       Constitutional: appears older than chronological age, poorly kept, cachectic, frail, chronically ill, NAD, calm, comfortable Eyes: PERRL, lids and conjunctivae normal ENMT: Mucous membranes are moist. Posterior pharynx clear of any exudate or lesions. Age-appropriate dentition. Hearing appropriate Neck: normal, supple, no masses, no thyromegaly Respiratory: clear to auscultation bilaterally, no wheezing, no crackles. Normal respiratory effort. No accessory muscle use.  Cardiovascular: Regular rate and rhythm, no murmurs / rubs / gallops. No extremity edema. 2+ pedal pulses. No carotid bruits.   Abdomen: Scaphoid abdomen, no tenderness, no masses palpated, no hepatosplenomegaly. Bowel sounds positive.  Musculoskeletal: no clubbing / cyanosis. No joint deformity upper and lower extremities. Good ROM, no contractures, no atrophy. Normal muscle tone.  Skin: no rashes, lesions, ulcers. No induration Neurologic: Sensation intact. Strength 5/5 in all 4.  Psychiatric: Normal judgment and insight. Alert and oriented x 3. Normal mood.   EKG: independently reviewed, showing sinus rhythm with a rate of 87, QTc 445  Chest x-ray on Admission: I personally reviewed and I agree with radiologist reading as below.  DG Chest 2 View  Result Date: 11/29/2020 CLINICAL DATA:  65 year old male with a history of weakness EXAM: CHEST - 2 VIEW COMPARISON:  05/01/2020, CT 05/01/2020 FINDINGS: Cardiomediastinal silhouette unchanged in size and contour. No central vascular congestion. No interlobular septal thickening. Reticular opacities persist throughout the lungs. Stigmata of emphysema, with increased retrosternal airspace, flattened hemidiaphragms, increased AP diameter, and hyperinflation on the AP view. Reticulonodular opacity at the left lung base, retrocardiac region persist compared to the prior plain film. No pneumothorax. Degenerative changes of the spine.  No acute displaced fracture. IMPRESSION: Persisting opacity at the left lung base compared to the prior plain film. Differential diagnosis includes ongoing, unresolved infection as well as indolent neoplasm. Follow-up chest CT is recommended. Chronic lung changes and emphysema. Electronically Signed   By: Gilmer Mor D.O.   On: 11/29/2020 12:17   CT ABDOMEN PELVIS W CONTRAST  Result Date: 11/29/2020 CLINICAL DATA:  Nausea, vomiting, abdominal pain  EXAM: CT ABDOMEN AND PELVIS WITH CONTRAST TECHNIQUE: Multidetector CT imaging of the abdomen and pelvis was performed using the standard protocol following bolus administration of intravenous contrast.  CONTRAST:  54mL OMNIPAQUE IOHEXOL 300 MG/ML  SOLN COMPARISON:  09/05/2016 FINDINGS: Lower chest: Bronchiectasis and airspace disease noted in the left lower lobe. This is worsened since prior study. This could reflect scarring although acute infiltrate/pneumonia cannot be excluded. Probable scarring in the medial right middle lobe. No effusions. Hepatobiliary: Gallbladder is contracted. No focal hepatic abnormality. Pancreas: No focal abnormality or ductal dilatation. Spleen: No focal abnormality.  Normal size. Adrenals/Urinary Tract: No adrenal abnormality. No focal renal abnormality. No stones or hydronephrosis. Urinary bladder is unremarkable. Stomach/Bowel: Large stool burden throughout the colon. No evidence of bowel obstruction. Vascular/Lymphatic: Aortic atherosclerosis. No evidence of aneurysm or adenopathy. Reproductive: Mildly prominent prostate with central calcifications. Other: No free fluid or free air. Musculoskeletal: No acute bony abnormality. IMPRESSION: Large stool burden throughout the colon. No evidence of bowel obstruction. Aortic atherosclerosis. Left lower lobe bronchiectasis with worsening airspace disease. This could reflect scarring although acute infiltrate/pneumonia cannot be excluded. Electronically Signed   By: Charlett Nose M.D.   On: 11/29/2020 11:29   Labs on Admission: I have personally reviewed following labs  CBC: Recent Labs  Lab 11/29/20 0949  WBC 20.4*  HGB 11.1*  HCT 35.1*  MCV 94.6  PLT 279   Basic Metabolic Panel: Recent Labs  Lab 11/29/20 0949  NA 141  K 4.1  CL 106  CO2 26  GLUCOSE 137*  BUN 17  CREATININE 0.78  CALCIUM 9.1   GFR: Estimated Creatinine Clearance: 59.9 mL/min (by C-G formula based on SCr of 0.78 mg/dL).  Liver Function Tests: Recent Labs  Lab 11/29/20 0949  AST 29  ALT 18  ALKPHOS 61  BILITOT 0.7  PROT 8.4*  ALBUMIN 4.3   Recent Labs  Lab 11/29/20 0949  LIPASE 30   Urine analysis:    Component Value Date/Time    COLORURINE YELLOW (A) 11/12/2019 0446   APPEARANCEUR HAZY (A) 11/12/2019 0446   LABSPEC 1.020 11/12/2019 0446   PHURINE 5.0 11/12/2019 0446   GLUCOSEU NEGATIVE 11/12/2019 0446   HGBUR NEGATIVE 11/12/2019 0446   BILIRUBINUR NEGATIVE 11/12/2019 0446   KETONESUR 5 (A) 11/12/2019 0446   PROTEINUR NEGATIVE 11/12/2019 0446   NITRITE NEGATIVE 11/12/2019 0446   LEUKOCYTESUR NEGATIVE 11/12/2019 0446   Kye Hedden N Yanette Tripoli D.O. [facility]  If 7PM-7AM, please contact overnight-coverage provider If 7AM-7PM, please contact day coverage provider [email or web address]  11/29/2020, 3:04 PM

## 2020-11-29 NOTE — ED Triage Notes (Signed)
Patient to ER for c/o mid lower abd pain with emesis x4 hours Patient has h/o gastritis/gastroenteritis several times in the past.

## 2020-11-29 NOTE — Progress Notes (Signed)
CODE SEPSIS - PHARMACY COMMUNICATION  **Broad Spectrum Antibiotics should be administered within 1 hour of Sepsis diagnosis**  Time Code Sepsis Called/Page Received: 1154  Antibiotics Ordered: ceftriaxone, metronidazole   Time of 1st antibiotic administration: 1215  Additional action taken by pharmacy: n/a  If necessary, Name of Provider/Nurse Contacted: n/a    Sharen Hones ,PharmD, BCPS Clinical Pharmacist  11/29/2020  11:55 AM

## 2020-11-30 DIAGNOSIS — K5900 Constipation, unspecified: Secondary | ICD-10-CM | POA: Diagnosis present

## 2020-11-30 DIAGNOSIS — J470 Bronchiectasis with acute lower respiratory infection: Secondary | ICD-10-CM | POA: Diagnosis present

## 2020-11-30 DIAGNOSIS — R112 Nausea with vomiting, unspecified: Secondary | ICD-10-CM | POA: Diagnosis present

## 2020-11-30 DIAGNOSIS — Z20822 Contact with and (suspected) exposure to covid-19: Secondary | ICD-10-CM | POA: Diagnosis present

## 2020-11-30 DIAGNOSIS — Z66 Do not resuscitate: Secondary | ICD-10-CM | POA: Diagnosis present

## 2020-11-30 DIAGNOSIS — G629 Polyneuropathy, unspecified: Secondary | ICD-10-CM | POA: Diagnosis present

## 2020-11-30 DIAGNOSIS — K219 Gastro-esophageal reflux disease without esophagitis: Secondary | ICD-10-CM | POA: Diagnosis present

## 2020-11-30 DIAGNOSIS — Z79899 Other long term (current) drug therapy: Secondary | ICD-10-CM | POA: Diagnosis not present

## 2020-11-30 DIAGNOSIS — D649 Anemia, unspecified: Secondary | ICD-10-CM | POA: Diagnosis present

## 2020-11-30 DIAGNOSIS — Z833 Family history of diabetes mellitus: Secondary | ICD-10-CM | POA: Diagnosis not present

## 2020-11-30 DIAGNOSIS — A419 Sepsis, unspecified organism: Secondary | ICD-10-CM | POA: Diagnosis present

## 2020-11-30 DIAGNOSIS — G894 Chronic pain syndrome: Secondary | ICD-10-CM | POA: Diagnosis present

## 2020-11-30 DIAGNOSIS — J189 Pneumonia, unspecified organism: Secondary | ICD-10-CM | POA: Diagnosis present

## 2020-11-30 DIAGNOSIS — J188 Other pneumonia, unspecified organism: Secondary | ICD-10-CM | POA: Diagnosis present

## 2020-11-30 DIAGNOSIS — R634 Abnormal weight loss: Secondary | ICD-10-CM | POA: Diagnosis present

## 2020-11-30 DIAGNOSIS — Z681 Body mass index (BMI) 19 or less, adult: Secondary | ICD-10-CM | POA: Diagnosis not present

## 2020-11-30 LAB — BASIC METABOLIC PANEL
Anion gap: 8 (ref 5–15)
BUN: 15 mg/dL (ref 8–23)
CO2: 26 mmol/L (ref 22–32)
Calcium: 8.9 mg/dL (ref 8.9–10.3)
Chloride: 105 mmol/L (ref 98–111)
Creatinine, Ser: 0.92 mg/dL (ref 0.61–1.24)
GFR, Estimated: >60 mL/min (ref >60)
Glucose, Bld: 90 mg/dL (ref 70–99)
Potassium: 4 mmol/L (ref 3.5–5.1)
Sodium: 139 mmol/L (ref 135–145)

## 2020-11-30 LAB — CBC
HCT: 30.7 % — ABNORMAL LOW (ref 39.0–52.0)
Hemoglobin: 9.9 g/dL — ABNORMAL LOW (ref 13.0–17.0)
MCH: 30.3 pg (ref 26.0–34.0)
MCHC: 32.2 g/dL (ref 30.0–36.0)
MCV: 93.9 fL (ref 80.0–100.0)
Platelets: 239 10*3/uL (ref 150–400)
RBC: 3.27 MIL/uL — ABNORMAL LOW (ref 4.22–5.81)
RDW: 13.4 % (ref 11.5–15.5)
WBC: 8 10*3/uL (ref 4.0–10.5)
nRBC: 0 % (ref 0.0–0.2)

## 2020-11-30 MED ORDER — MELATONIN 5 MG PO TABS
5.0000 mg | ORAL_TABLET | Freq: Every day | ORAL | Status: DC
  Administered 2020-12-01: 5 mg via ORAL
  Filled 2020-11-30 (×3): qty 1

## 2020-11-30 MED ORDER — ACETAMINOPHEN 650 MG RE SUPP: 325.0000 mg | Freq: Four times a day (QID) | RECTAL | Status: DC | PRN

## 2020-11-30 MED ORDER — ACETAMINOPHEN 500 MG PO TABS
1000.0000 mg | ORAL_TABLET | Freq: Four times a day (QID) | ORAL | Status: DC | PRN
  Administered 2020-12-01: 1000 mg via ORAL
  Filled 2020-11-30 (×2): qty 2

## 2020-11-30 NOTE — Progress Notes (Signed)
Progress Note    Drew Holt  JKD:[id number] DOB: [birth year]  DOA: 11/29/2020 PCP: System, Provider Not In    Brief Narrative:     Medical records reviewed and are as summarized below:  Drew Holt is an 65 y.o. male with medical history significant for diffuse axonal neuropathy, chronic pain syndrome, rash and nonspecific skin eruptions, ED, presents to the emergency department for chief concerns of nausea and vomiting and shortness of breath.  Assessment/Plan:   Principal Problem:   CAP (community acquired pneumonia) Active Problems:   GERD (gastroesophageal reflux disease)   Nausea vomiting and diarrhea   Intractable vomiting with nausea   Pneumonia Meet sepsis criteria, sinus tachycardia, leukocytosis with WBC of 20.4, right lower lobe pneumonia -Lactic was negative x1 -BC NGTD -continue azithromycin and ceftriaxone -ambulate patient and check oxygen status  Abdominal pain-suspect secondary to constipation -Acetaminophen 325 mg as needed for mild pain -Toradol 15 mcg injection every 8 hours as needed for moderate pain -Chronic opioid use -Resumed home Senokot 2 tablets -GlycoLax scheduled twice daily including now -can do trial of resistor in AM if not successful with bowel regimen  Anemia- normocytic  -trend (baseline has been 9-11) -CBC in AM  GERD-famotidine 20 mg nightly for heartburn  Weight loss-unintentional 20 pound  -Outpatient follow-up -Checking TSH --  Will need outpatient CT scan of lungs: Persisting opacity at the left lung base compared to the prior plain film. Differential diagnosis includes ongoing, unresolved infection as well as indolent neoplasm. Follow-up chest CT is recommended.     Family Communication/Anticipated D/C date and plan/Code Status   DVT prophylaxis: Lovenox ordered. Code Status: DNR  Disposition Plan: Status is: Observation  The patient will require care spanning > 2 midnights and should be moved to inpatient  because: Inpatient level of care appropriate due to severity of illness  Dispo: The patient is from: Home              Anticipated d/c is to: Home              Patient currently is not medically stable to d/c.- needs BM, home O2 ambulation study-- suspect can be d/c'd in AM   Difficult to place patient No         Medical Consultants:    None.    Subjective:   No BM overnight  Objective:    Vitals:   11/29/20 1927 11/30/20 0101 11/30/20 0505 11/30/20 0809  BP: (!) 99/54 107/71 108/60 120/66  Pulse: 75 73 70 72  Resp: 18 17 15 16   Temp: 98.4 F (36.9 C) 98.2 F (36.8 C) 97.9 F (36.6 C) 98.6 F (37 C)  TempSrc: Oral Oral Oral   SpO2: 95% 96% 100% 100%  Weight:      Height:        Intake/Output Summary (Last 24 hours) at 11/30/2020 1026 Last data filed at 11/30/2020 1018 Gross per 24 hour  Intake 220.73 ml  Output 200 ml  Net 20.73 ml   Filed Weights   11/29/20 0937  Weight: 45.4 kg    Exam:  General: Appearance:    Thin male in no acute distress     Lungs:      respirations unlabored  Heart:    Normal heart rate. Normal rhythm. No murmurs, rubs, or gallops.   MS:   All extremities are intact.   Neurologic:   Awake, alert, oriented x 3. No apparent focal neurological  defect.     Data Reviewed:   I have personally reviewed following labs and imaging studies:  Labs: Labs show the following:   Basic Metabolic Panel: Recent Labs  Lab 11/29/20 0949 11/30/20 0415  NA 141 139  K 4.1 4.0  CL 106 105  CO2 26 26  GLUCOSE 137* 90  BUN 17 15  CREATININE 0.78 0.92  CALCIUM 9.1 8.9   GFR Estimated Creatinine Clearance: 52.1 mL/min (by C-G formula based on SCr of 0.92 mg/dL). Liver Function Tests: Recent Labs  Lab 11/29/20 0949  AST 29  ALT 18  ALKPHOS 61  BILITOT 0.7  PROT 8.4*  ALBUMIN 4.3   Recent Labs  Lab 11/29/20 0949  LIPASE 30   No results for input(s): AMMONIA in the last 168 hours. Coagulation profile No results  for input(s): INR, PROTIME in the last 168 hours.  CBC: Recent Labs  Lab 11/29/20 0949 11/30/20 0415  WBC 20.4* 8.0  HGB 11.1* 9.9*  HCT 35.1* 30.7*  MCV 94.6 93.9  PLT 279 239   Cardiac Enzymes: No results for input(s): CKTOTAL, CKMB, CKMBINDEX, TROPONINI in the last 168 hours. BNP (last 3 results) No results for input(s): PROBNP in the last 8760 hours. CBG: No results for input(s): GLUCAP in the last 168 hours. D-Dimer: No results for input(s): DDIMER in the last 72 hours. Hgb A1c: No results for input(s): HGBA1C in the last 72 hours. Lipid Profile: No results for input(s): CHOL, HDL, LDLCALC, TRIG, CHOLHDL, LDLDIRECT in the last 72 hours. Thyroid function studies: Recent Labs    11/29/20 0949  TSH 1.820   Anemia work up: No results for input(s): VITAMINB12, FOLATE, FERRITIN, TIBC, IRON, RETICCTPCT in the last 72 hours. Sepsis Labs: Recent Labs  Lab 11/29/20 0949 11/29/20 1253 11/30/20 0415  WBC 20.4*  --  8.0  LATICACIDVEN  --  1.3  --     Microbiology Recent Results (from the past 240 hour(s))  Culture, blood (single)     Status: None (Preliminary result)   Collection Time: 11/29/20 12:11 PM   Specimen: BLOOD  Result Value Ref Range Status   Specimen Description BLOOD LEFT ANTECUBITAL  Final   Special Requests   Final    BOTTLES DRAWN AEROBIC AND ANAEROBIC Blood Culture adequate volume   Culture   Final    NO GROWTH < 24 HOURS Performed at [facility], [address] [place], [place] [address]    Report Status PENDING  Incomplete  Culture, blood (single) w Reflex to ID Panel     Status: None (Preliminary result)   Collection Time: 11/29/20 12:11 PM   Specimen: BLOOD  Result Value Ref Range Status   Specimen Description BLOOD RIGHT ANTECUBITAL  Final   Special Requests   Final    BOTTLES DRAWN AEROBIC AND ANAEROBIC Blood Culture adequate volume   Culture   Final    NO GROWTH < 24 HOURS Performed at [facility], [address] [place], [place] [address]    Report Status PENDING  Incomplete  SARS CORONAVIRUS 2 (TAT 6-24 HRS) Nasopharyngeal Nasopharyngeal Swab     Status: None   Collection Time: 11/29/20  1:14 PM   Specimen: Nasopharyngeal Swab  Result Value Ref Range Status   SARS Coronavirus 2 NEGATIVE NEGATIVE Final    Comment: (NOTE) SARS-CoV-2 target nucleic acids are NOT DETECTED.  The SARS-CoV-2 RNA is generally detectable in upper and lower respiratory specimens during the acute phase of infection. Negative results do not  preclude SARS-CoV-2 infection, do not rule out co-infections with other pathogens, and should not be used as the sole basis for treatment or other patient management decisions. Negative results must be combined with clinical observations, patient history, and epidemiological information. The expected result is Negative.  Fact Sheet for Patients: HairSlick.no  Fact Sheet for Healthcare Providers: quierodirigir.com  This test is not yet approved or cleared by the [place] FDA and  has been authorized for detection and/or diagnosis of SARS-CoV-2 by FDA under an Emergency Use Authorization (EUA). This EUA will remain  in effect (meaning this test can be used) for the duration of the COVID-19 declaration under Se ction 564(b)(1) of the Act, 21 U.S.C. section 360bbb-3(b)(1), unless the authorization is terminated or revoked sooner.  Performed at [facility], [address] [place], [place] [address]     Procedures and diagnostic studies:  DG Chest 2 View  Result Date: 11/29/2020 CLINICAL DATA:  65 year old male with a history of weakness EXAM: CHEST - 2 VIEW COMPARISON:  05/01/2020, CT 05/01/2020 FINDINGS: Cardiomediastinal silhouette unchanged in size and contour. No central vascular congestion. No interlobular septal thickening. Reticular opacities persist throughout the lungs. Stigmata of emphysema, with  increased retrosternal airspace, flattened hemidiaphragms, increased AP diameter, and hyperinflation on the AP view. Reticulonodular opacity at the left lung base, retrocardiac region persist compared to the prior plain film. No pneumothorax. Degenerative changes of the spine.  No acute displaced fracture. IMPRESSION: Persisting opacity at the left lung base compared to the prior plain film. Differential diagnosis includes ongoing, unresolved infection as well as indolent neoplasm. Follow-up chest CT is recommended. Chronic lung changes and emphysema. Electronically Signed   By: Gilmer Mor D.O.   On: 11/29/2020 12:17   CT ABDOMEN PELVIS W CONTRAST  Result Date: 11/29/2020 CLINICAL DATA:  Nausea, vomiting, abdominal pain EXAM: CT ABDOMEN AND PELVIS WITH CONTRAST TECHNIQUE: Multidetector CT imaging of the abdomen and pelvis was performed using the standard protocol following bolus administration of intravenous contrast. CONTRAST:  66mL OMNIPAQUE IOHEXOL 300 MG/ML  SOLN COMPARISON:  09/05/2016 FINDINGS: Lower chest: Bronchiectasis and airspace disease noted in the left lower lobe. This is worsened since prior study. This could reflect scarring although acute infiltrate/pneumonia cannot be excluded. Probable scarring in the medial right middle lobe. No effusions. Hepatobiliary: Gallbladder is contracted. No focal hepatic abnormality. Pancreas: No focal abnormality or ductal dilatation. Spleen: No focal abnormality.  Normal size. Adrenals/Urinary Tract: No adrenal abnormality. No focal renal abnormality. No stones or hydronephrosis. Urinary bladder is unremarkable. Stomach/Bowel: Large stool burden throughout the colon. No evidence of bowel obstruction. Vascular/Lymphatic: Aortic atherosclerosis. No evidence of aneurysm or adenopathy. Reproductive: Mildly prominent prostate with central calcifications. Other: No free fluid or free air. Musculoskeletal: No acute bony abnormality. IMPRESSION: Large stool burden  throughout the colon. No evidence of bowel obstruction. Aortic atherosclerosis. Left lower lobe bronchiectasis with worsening airspace disease. This could reflect scarring although acute infiltrate/pneumonia cannot be excluded. Electronically Signed   By: Charlett Nose M.D.   On: 11/29/2020 11:29    Medications:   . enoxaparin (LOVENOX) injection  30 mg Subcutaneous Q24H  . polyethylene glycol  17 g Oral BID  . senna-docusate  2 tablet Oral BID   Continuous Infusions: . azithromycin 500 mg (11/30/20 1005)  . cefTRIAXone (ROCEPHIN)  IV Stopped (11/30/20 1000)     LOS: 0 days   Joseph Art  [facility]   How to contact the [facility] Attending or Consulting provider 7A -  7P or covering provider during after hours 7P -7A, for this patient?  1. Check the care team in [facility] and look for a) attending/consulting TRH provider listed and b) the [facility] team listed 2. Log into [email or web address] and use [place]'s universal password to access. If you do not have the password, please contact the hospital operator. 3. Locate the [facility] provider you are looking for under [facility] and page to a number that you can be directly reached. 4. If you still have difficulty reaching the provider, please page the [facility] (Director on Call) for the Hospitalists listed on amion for assistance.  11/30/2020, 10:26 AM

## 2020-11-30 NOTE — TOC Initial Note (Signed)
Transition of Care [facility]) - Initial/Assessment Note    Patient Details  Name: Drew Holt MRN: [id number] Date of Birth: December 11, 1955  [facility]) CM/SW Contact:    Shelbie Hutching, RN Phone Number: 11/30/2020, 3:43 PM  Clinical Narrative:                 Patient admitted to the hospital with pneumonia.  RNCM met with patient at the bedside.  Patient's PCP is Dr. Sher[facility]le Lis at [facility].  Patient lives alone in apartment in [place].  He does report that at the end of the month he and his daughter will be moving in together so he will not be living alone any more.  He can drive but prefers not too.  Daughter, Drew Holt can provide transportation.  Patient uses a walker sometimes at home when he feels that he needs to.   No discharge needs identified at this time.   Expected Discharge Plan: Home/Self Care Barriers to Discharge: Continued Medical Work up   Patient Goals and CMS Choice Patient states their goals for this hospitalization and ongoing recovery are:: Patient wondering when he gets to go home.      Expected Discharge Plan and Services Expected Discharge Plan: Home/Self Care       Living arrangements for the past 2 months: Apartment                 DME Arranged: N/A DME Agency: NA       HH Arranged: NA          Prior Living Arrangements/Services Living arrangements for the past 2 months: Apartment Lives with:: Self Patient language and need for interpreter reviewed:: Yes Do you feel safe going back to the place where you live?: Yes      Need for Family Participation in Patient Care: Yes (Comment) (pneumonia) Care giver support system in place?: Yes (comment) (daughter) Current home services: DME (walker) Criminal Activity/Legal Involvement Pertinent to Current Situation/Hospitalization: No - Comment as needed  Activities of Daily Living Home Assistive Devices/Equipment: None ADL Screening (condition at time of admission) Patient's cognitive ability adequate  to safely complete daily activities?: Yes Is the patient deaf or have difficulty hearing?: No Does the patient have difficulty seeing, even when wearing glasses/contacts?: No Does the patient have difficulty concentrating, remembering, or making decisions?: No Patient able to express need for assistance with ADLs?: Yes Does the patient have difficulty dressing or bathing?: No Independently performs ADLs?: Yes (appropriate for developmental age) Does the patient have difficulty walking or climbing stairs?: No Weakness of Legs: None Weakness of Arms/Hands: None  Permission Sought/Granted Permission sought to share information with : Case Manager,Family Supports Permission granted to share information with : Yes, Verbal Permission Granted  Share Information with NAME: Drew Holt     Permission granted to share info w Relationship: daughter     Emotional Assessment Appearance:: Appears stated age Attitude/Demeanor/Rapport: Engaged Affect (typically observed): Accepting Orientation: : Oriented to Self,Oriented to Place,Oriented to  Time,Oriented to Situation Alcohol / Substance Use: Not Applicable Psych Involvement: No (comment)  Admission diagnosis:  Intractable vomiting with nausea [R11.2] Sepsis without acute organ dysfunction, due to unspecified organism [facility]) [A41.9] Pneumonia [J18.9] Patient Active Problem List   Diagnosis Date Noted  . Pneumonia 11/30/2020  . Intractable vomiting with nausea 11/29/2020  . Chest pain 05/01/2020  . CAP (community acquired pneumonia) 05/01/2020  . Hypokalemia 05/01/2020  . GERD (gastroesophageal reflux disease) 05/01/2020  . Nausea vomiting and diarrhea 05/01/2020  .  Acute metabolic encephalopathy 79/19/9579   PCP:  System, Provider Not In Pharmacy:   [id number] [place] [id number]: [phone]: [phone]    Social Determinants of Health (SDOH) Interventions    Readmission  Risk Interventions No flowsheet data found.

## 2020-12-01 LAB — CBC
HCT: 31.9 % — ABNORMAL LOW (ref 39.0–52.0)
Hemoglobin: 10.2 g/dL — ABNORMAL LOW (ref 13.0–17.0)
MCH: 30.2 pg (ref 26.0–34.0)
MCHC: 32 g/dL (ref 30.0–36.0)
MCV: 94.4 fL (ref 80.0–100.0)
Platelets: 235 10*3/uL (ref 150–400)
RBC: 3.38 MIL/uL — ABNORMAL LOW (ref 4.22–5.81)
RDW: 13.2 % (ref 11.5–15.5)
WBC: 5 10*3/uL (ref 4.0–10.5)
nRBC: 0 % (ref 0.0–0.2)

## 2020-12-01 MED ORDER — AMOXICILLIN-POT CLAVULANATE 875-125 MG PO TABS: 1.0000 | ORAL_TABLET | Freq: Two times a day (BID) | ORAL | 0 refills | Status: AC

## 2020-12-01 MED ORDER — ONDANSETRON 4 MG PO TBDP
4.0000 mg | ORAL_TABLET | Freq: Three times a day (TID) | ORAL | 3 refills | Status: AC | PRN
Start: 2020-12-01 — End: ?

## 2020-12-01 MED ORDER — METHYLNALTREXONE BROMIDE 12 MG/0.6ML ~~LOC~~ SOLN
12.0000 mg | Freq: Once | SUBCUTANEOUS | Status: AC
  Administered 2020-12-01: 09:00:00 12 mg via SUBCUTANEOUS
  Filled 2020-12-01: qty 0.6

## 2020-12-01 MED ORDER — POLYETHYLENE GLYCOL 3350 17 G PO PACK: 17.0000 g | PACK | Freq: Every day | ORAL | 0 refills | Status: DC

## 2020-12-01 MED ORDER — HYDROCODONE-ACETAMINOPHEN 10-325 MG PO TABS
1.0000 | ORAL_TABLET | ORAL | Status: DC | PRN
Start: 2020-12-01 — End: 2020-12-01
  Administered 2020-12-01: 08:00:00 1 via ORAL
  Filled 2020-12-01: qty 1

## 2020-12-01 MED ORDER — KETOROLAC TROMETHAMINE 30 MG/ML IJ SOLN
30.0000 mg | Freq: Once | INTRAMUSCULAR | Status: AC
  Administered 2020-12-01: 30 mg via INTRAVENOUS
  Filled 2020-12-01: qty 1

## 2020-12-01 NOTE — Progress Notes (Signed)
Dr Maryfrances Bunnell aware that pt had large BM

## 2020-12-01 NOTE — Discharge Summary (Signed)
Physician Discharge Summary  Drew Holt:[id number] DOB: 09/04/1956 DOA: 11/29/2020  PCP: Drew Lamb, NP  Admit date: 11/29/2020 Discharge date: 12/01/2020  Admitted From: Home  Disposition:  Home   Recommendations for Outpatient Follow-up:  1. Follow up with PCP Drew Holt in 1 week 2. Drew Holt: Given weight loss, please ensure patient is up to date on age-appropriate cancer screenings ALSO please obtain CT chest in 2-3 months to follow up resolution of current chest imaging 3. Drew Holt: pleae obtain CBC in 2-3 months to document resolution of anemia       Home Health: None  Equipment/Devices: None new  Discharge Condition: Good  CODE STATUS: FULL Diet recommendation: Regular  Brief/Interim Summary: Drew Holt is a 65 y.o. M with DAN and chronic pain as well as a dermatologic disorder, NOS, who presented with 1 day n/v and SOB, fever and chills.  In the ER, afebrile, WBC 20K.  CT abdomen showed constipation, and LLL bronchiectasis with pneumonia.  Started on antibiotics and admitted for pneumonia.      PRINCIPAL HOSPITAL DIAGNOSIS: Sepsis due to Pneumonia      Discharge Diagnoses:  Sepsis due to pneumonia Met criteria on admisison with tachycardia, leukocytosis, pneumonia.  CT abdomen and pelvis obtained due to vomiting, showed conspitation only.  CT did show lower lung fields with masslike density in lower lobes.  Radiology recommend CT imaging follow up in 2-3 months to document resolution.  Treated empirically with antibiotics, improved.  Patient now mentating at baseline, taking orals.  Temp < 100 F, heart rate < 100bpm, RR < 24, SpO2 at baseline.   Stable for discharge with complete 7 days course with Augmentin.    Intractable nausea and vomiting Constipation Treated with Zofran with good effect.  Constipation recalcitrant to MiraLAX combined with senna.  Relistor given once, had large BM.  WIll resume stool regimen in 3  days.   Diffuse axonal neuropathy  Anemia  Unintentional weight loss Follow up CT chest in 2-3 months             Discharge Instructions  Discharge Instructions    Discharge instructions   Complete by: As directed    From Dr. Loleta Holt: You were admitted for pneumonia  You were treated with antibiotics here, and you should continue the course with 5 more days Augmentin Take Augmentin 875-125 mg twice daily for 5 more days  Go see Drew Holt in 1 week  Ask her for what work up you should do for your weight loss We recommend AT LEAST: -all recommended routine cancer screenings -a CT of your chest in 2-3 months   For constipation: Don't take anything for 3 days By Saturday, you may start a regimen of Senokot-S (senna plus docusate) and MiraLAX (polyethylene glycol)  STart with taking each once daily If you don't have a soft BM every day or every other day, increase to twice daily   You may look for generic equivalents   Increase activity slowly   Complete by: As directed      Allergies as of 12/01/2020   No Known Allergies     Medication List    TAKE these medications   amoxicillin-clavulanate 875-125 MG tablet Commonly known as: Augmentin Take 1 tablet by mouth 2 (two) times daily for 5 days.   cholecalciferol 25 MCG (1000 UNIT) tablet Commonly known as: VITAMIN D3 Take 1,000 Units by mouth daily.   diphenhydrAMINE 25 MG tablet Commonly known as: BENADRYL Take 25-50 mg by  mouth daily as needed for allergies or itching.   famotidine 20 MG tablet Commonly known as: PEPCID Take 20 mg by mouth 2 (two) times daily as needed for heartburn or indigestion.   HYDROcodone-acetaminophen 10-325 MG tablet Commonly known as: NORCO Take 1 tablet by mouth every 4 (four) hours as needed for moderate pain or severe pain.   ondansetron 4 MG disintegrating tablet Commonly known as: ZOFRAN-ODT Take 1 tablet (4 mg total) by mouth every 8 (eight) hours as needed  for nausea or vomiting.   polyethylene glycol 17 g packet Commonly known as: MIRALAX / GLYCOLAX Take 17 g by mouth daily.   sennosides-docusate sodium 8.6-50 MG tablet Commonly known as: SENOKOT-S Take 2 tablets by mouth daily as needed for constipation.   therapeutic multivitamin-minerals tablet Take 1 tablet by mouth daily.       Follow-up Information    Drew Lamb, NP. Schedule an appointment as soon as possible for a visit in 1 week(s).   Contact information: [address] [place] [address] [phone]              No Known Allergies    Procedures/Studies: DG Chest 2 View  Result Date: 11/29/2020 CLINICAL DATA:  65 year old male with a history of weakness EXAM: CHEST - 2 VIEW COMPARISON:  05/01/2020, CT 05/01/2020 FINDINGS: Cardiomediastinal silhouette unchanged in size and contour. No central vascular congestion. No interlobular septal thickening. Reticular opacities persist throughout the lungs. Stigmata of emphysema, with increased retrosternal airspace, flattened hemidiaphragms, increased AP diameter, and hyperinflation on the AP view. Reticulonodular opacity at the left lung base, retrocardiac region persist compared to the prior plain film. No pneumothorax. Degenerative changes of the spine.  No acute displaced fracture. IMPRESSION: Persisting opacity at the left lung base compared to the prior plain film. Differential diagnosis includes ongoing, unresolved infection as well as indolent neoplasm. Follow-up chest CT is recommended. Chronic lung changes and emphysema. Electronically Signed   By: Corrie Mckusick D.O.   On: 11/29/2020 12:17   CT ABDOMEN PELVIS W CONTRAST  Result Date: 11/29/2020 CLINICAL DATA:  Nausea, vomiting, abdominal pain EXAM: CT ABDOMEN AND PELVIS WITH CONTRAST TECHNIQUE: Multidetector CT imaging of the abdomen and pelvis was performed using the standard protocol following bolus administration of intravenous contrast. CONTRAST:  78m  OMNIPAQUE IOHEXOL 300 MG/ML  SOLN COMPARISON:  09/05/2016 FINDINGS: Lower chest: Bronchiectasis and airspace disease noted in the left lower lobe. This is worsened since prior study. This could reflect scarring although acute infiltrate/pneumonia cannot be excluded. Probable scarring in the medial right middle lobe. No effusions. Hepatobiliary: Gallbladder is contracted. No focal hepatic abnormality. Pancreas: No focal abnormality or ductal dilatation. Spleen: No focal abnormality.  Normal size. Adrenals/Urinary Tract: No adrenal abnormality. No focal renal abnormality. No stones or hydronephrosis. Urinary bladder is unremarkable. Stomach/Bowel: Large stool burden throughout the colon. No evidence of bowel obstruction. Vascular/Lymphatic: Aortic atherosclerosis. No evidence of aneurysm or adenopathy. Reproductive: Mildly prominent prostate with central calcifications. Other: No free fluid or free air. Musculoskeletal: No acute bony abnormality. IMPRESSION: Large stool burden throughout the colon. No evidence of bowel obstruction. Aortic atherosclerosis. Left lower lobe bronchiectasis with worsening airspace disease. This could reflect scarring although acute infiltrate/pneumonia cannot be excluded. Electronically Signed   By: KRolm BaptiseM.D.   On: 11/29/2020 11:29      Subjective: Feels well.  No fever, no confusion, no vomiting.  No dyspnea.  No chest pain.  Discharge Exam: Vitals:   12/01/20 0746 12/01/20 1140  BP: 129/81 121/86  Pulse: 67 70  Resp: 20 16  Temp: 98.2 F (36.8 C) 98.6 F (37 C)  SpO2: 98% 99%   Vitals:   11/30/20 2343 12/01/20 0349 12/01/20 0746 12/01/20 1140  BP: (!) 146/110 107/68 129/81 121/86  Pulse: 91 68 67 70  Resp: _0 Temp: 98.8 F (37.1 C) 98 F (36.7 C) 98.2 F (36.8 C) 98.6 F (37 C)  TempSrc: Oral Oral Oral   SpO2: 96% 96% 98% 99%  Weight:      Height:        General: Pt is alert, awake, not in acute distress Cardiovascular: RRR, nl S1-S2,  no murmurs appreciated.   No LE edema.   Respiratory: Normal respiratory rate and rhythm.  CTAB without rales or wheezes. Abdominal: Abdomen soft and non-tender.  No distension or HSM.   Neuro/Psych: Strength symmetric in upper and lower extremities.  Judgment and insight appear normal.   The results of significant diagnostics from this hospitalization (including imaging, microbiology, ancillary and laboratory) are listed below for reference.     Microbiology: Recent Results (from the past 240 hour(s))  Culture, blood (single)     Status: None (Preliminary result)   Collection Time: 11/29/20 12:11 PM   Specimen: BLOOD  Result Value Ref Range Status   Specimen Description BLOOD LEFT ANTECUBITAL  Final   Special Requests   Final    BOTTLES DRAWN AEROBIC AND ANAEROBIC Blood Culture adequate volume   Culture   Final    NO GROWTH 2 DAYS Performed at [facility], [address] [place], [place] [address]    Report Status PENDING  Incomplete  Culture, blood (single) w Reflex to ID Panel     Status: None (Preliminary result)   Collection Time: 11/29/20 12:11 PM   Specimen: BLOOD  Result Value Ref Range Status   Specimen Description BLOOD RIGHT ANTECUBITAL  Final   Special Requests   Final    BOTTLES DRAWN AEROBIC AND ANAEROBIC Blood Culture adequate volume   Culture   Final    NO GROWTH 2 DAYS Performed at [facility], [place], [place] [address]    Report Status PENDING  Incomplete  SARS CORONAVIRUS 2 (TAT 6-24 HRS) Nasopharyngeal Nasopharyngeal Swab     Status: None   Collection Time: 11/29/20  1:14 PM   Specimen: Nasopharyngeal Swab  Result Value Ref Range Status   SARS Coronavirus 2 NEGATIVE NEGATIVE Final    Comment: (NOTE) SARS-CoV-2 target nucleic acids are NOT DETECTED.  The SARS-CoV-2 RNA is generally detectable in upper and lower respiratory specimens during the acute phase of infection. Negative results do not preclude SARS-CoV-2  infection, do not rule out co-infections with other pathogens, and should not be used as the sole basis for treatment or other patient management decisions. Negative results must be combined with clinical observations, patient history, and epidemiological information. The expected result is Negative.  Fact Sheet for Patients: SugarRoll.be  Fact Sheet for Healthcare Providers: https://www.woods-mathews.com/  This test is not yet approved or cleared by the [place] FDA and  has been authorized for detection and/or diagnosis of SARS-CoV-2 by FDA under an Emergency Use Authorization (EUA). This EUA will remain  in effect (meaning this test can be used) for the duration of the COVID-19 declaration under Se ction 564(b)(1) of the Act, 21 U.S.C. section 360bbb-3(b)(1), unless the authorization is terminated or revoked sooner.  Performed at [facility], [place]  [address], [place], [place] [address]      Labs: BNP (last 3 results) No results for input(s): BNP in the last 8760 hours. Basic Metabolic Panel: Recent Labs  Lab 11/29/20 0949 11/30/20 0415  NA 141 139  K 4.1 4.0  CL 106 105  CO2 26 26  GLUCOSE 137* 90  BUN 17 15  CREATININE 0.78 0.92  CALCIUM 9.1 8.9   Liver Function Tests: Recent Labs  Lab 11/29/20 0949  AST 29  ALT 18  ALKPHOS 61  BILITOT 0.7  PROT 8.4*  ALBUMIN 4.3   Recent Labs  Lab 11/29/20 0949  LIPASE 30   No results for input(s): AMMONIA in the last 168 hours. CBC: Recent Labs  Lab 11/29/20 0949 11/30/20 0415 12/01/20 0621  WBC 20.4* 8.0 5.0  HGB 11.1* 9.9* 10.2*  HCT 35.1* 30.7* 31.9*  MCV 94.6 93.9 94.4  PLT 279 239 235   Cardiac Enzymes: No results for input(s): CKTOTAL, CKMB, CKMBINDEX, TROPONINI in the last 168 hours. BNP: Invalid input(s): POCBNP CBG: No results for input(s): GLUCAP in the last 168 hours. D-Dimer No results for input(s): DDIMER in the last 72 hours. Hgb  A1c No results for input(s): HGBA1C in the last 72 hours. Lipid Profile No results for input(s): CHOL, HDL, LDLCALC, TRIG, CHOLHDL, LDLDIRECT in the last 72 hours. Thyroid function studies Recent Labs    11/29/20 0949  TSH 1.820   Anemia work up No results for input(s): VITAMINB12, FOLATE, FERRITIN, TIBC, IRON, RETICCTPCT in the last 72 hours. Urinalysis    Component Value Date/Time   COLORURINE YELLOW (A) 11/29/2020 2221   APPEARANCEUR CLEAR (A) 11/29/2020 2221   LABSPEC 1.036 (H) 11/29/2020 2221   PHURINE 5.0 11/29/2020 2221   GLUCOSEU NEGATIVE 11/29/2020 2221   HGBUR MODERATE (A) 11/29/2020 2221   BILIRUBINUR NEGATIVE 11/29/2020 2221   KETONESUR 5 (A) 11/29/2020 2221   PROTEINUR NEGATIVE 11/29/2020 2221   NITRITE NEGATIVE 11/29/2020 2221   LEUKOCYTESUR NEGATIVE 11/29/2020 2221   Sepsis Labs Invalid input(s): PROCALCITONIN,  WBC,  LACTICIDVEN Microbiology Recent Results (from the past 240 hour(s))  Culture, blood (single)     Status: None (Preliminary result)   Collection Time: 11/29/20 12:11 PM   Specimen: BLOOD  Result Value Ref Range Status   Specimen Description BLOOD LEFT ANTECUBITAL  Final   Special Requests   Final    BOTTLES DRAWN AEROBIC AND ANAEROBIC Blood Culture adequate volume   Culture   Final    NO GROWTH 2 DAYS Performed at [facility], [address] [place], [place] [address]    Report Status PENDING  Incomplete  Culture, blood (single) w Reflex to ID Panel     Status: None (Preliminary result)   Collection Time: 11/29/20 12:11 PM   Specimen: BLOOD  Result Value Ref Range Status   Specimen Description BLOOD RIGHT ANTECUBITAL  Final   Special Requests   Final    BOTTLES DRAWN AEROBIC AND ANAEROBIC Blood Culture adequate volume   Culture   Final    NO GROWTH 2 DAYS Performed at [facility], [address], [place], [place] [address]    Report Status PENDING  Incomplete  SARS CORONAVIRUS 2 (TAT 6-24 HRS) Nasopharyngeal  Nasopharyngeal Swab     Status: None   Collection Time: 11/29/20  1:14 PM   Specimen: Nasopharyngeal Swab  Result Value Ref Range Status   SARS Coronavirus 2 NEGATIVE NEGATIVE Final    Comment: (NOTE) SARS-CoV-2 target nucleic acids are NOT DETECTED.  The SARS-CoV-2 RNA is generally detectable in upper and lower respiratory specimens during the acute phase of infection. Negative results do not preclude SARS-CoV-2 infection, do not rule out co-infections with other pathogens, and should not be used as the sole basis for treatment or other patient management decisions. Negative results must be combined with clinical observations, patient history, and epidemiological information. The expected result is Negative.  Fact Sheet for Patients: SugarRoll.be  Fact Sheet for Healthcare Providers: https://www.woods-mathews.com/  This test is not yet approved or cleared by the [place] FDA and  has been authorized for detection and/or diagnosis of SARS-CoV-2 by FDA under an Emergency Use Authorization (EUA). This EUA will remain  in effect (meaning this test can be used) for the duration of the COVID-19 declaration under Se ction 564(b)(1) of the Act, 21 U.S.C. section 360bbb-3(b)(1), unless the authorization is terminated or revoked sooner.  Performed at [facility], [place] [address] [place], [place] [address]      Time coordinating discharge: 25 minutes         SIGNED:   Edwin Dada, MD  [facility] 12/01/2020, 8:32 PM

## 2020-12-04 LAB — CULTURE, BLOOD (SINGLE)
Culture: NO GROWTH
Culture: NO GROWTH
Special Requests: ADEQUATE
Special Requests: ADEQUATE

## 2020-12-28 ENCOUNTER — Encounter: Payer: Self-pay | Admitting: *Deleted

## 2021-02-03 ENCOUNTER — Other Ambulatory Visit: Payer: Self-pay

## 2021-02-03 ENCOUNTER — Emergency Department
Admission: EM | Admit: 2021-02-03 | Discharge: 2021-02-03 | Discharge status: Against Medical Advice | Payer: Medicare HMO | Attending: Emergency Medicine | Admitting: Emergency Medicine

## 2021-02-03 ENCOUNTER — Emergency Department: Payer: Medicare HMO

## 2021-02-03 DIAGNOSIS — R519 Headache, unspecified: Secondary | ICD-10-CM | POA: Diagnosis present

## 2021-02-03 DIAGNOSIS — R1110 Vomiting, unspecified: Secondary | ICD-10-CM | POA: Diagnosis not present

## 2021-02-03 DIAGNOSIS — U071 COVID-19: Secondary | ICD-10-CM | POA: Diagnosis not present

## 2021-02-03 DIAGNOSIS — Z20822 Contact with and (suspected) exposure to covid-19: Secondary | ICD-10-CM

## 2021-02-03 LAB — RESP PANEL BY RT-PCR (FLU A&B, COVID) ARPGX2
Influenza A by PCR: NEGATIVE
Influenza B by PCR: NEGATIVE
SARS Coronavirus 2 by RT PCR: POSITIVE — AB

## 2021-02-03 MED ORDER — HYDROCODONE-ACETAMINOPHEN 5-325 MG PO TABS
1.0000 | ORAL_TABLET | Freq: Once | ORAL | Status: AC
  Administered 2021-02-03: 1 via ORAL
  Filled 2021-02-03: qty 1

## 2021-02-03 NOTE — ED Triage Notes (Signed)
Pt arrives via pov from home. Pt c/o emesis and severe headache. Pt reports having a Positive covid exposure last week. NAD noted at his time.

## 2021-02-03 NOTE — ED Notes (Signed)
See triage note  States he did have COVID exposure last week  Low grade temp on arrival  Positive h/a   Sl  cough

## 2021-02-03 NOTE — ED Provider Notes (Signed)
[facility] Emergency Department Provider Note  ____________________________________________   Event Date/Time   First MD Initiated Contact with Patient 02/03/21 1144     (approximate)  I have reviewed the triage vital signs and the nursing notes.   HISTORY  Chief Complaint Headache and Emesis    HPI Drew Holt is a 65 y.o. male presents emergency department with a severe headache and vomiting.  Patient states he had a positive COVID exposure last week.  States headache started 2 days ago.  States he has history of pneumonia.  Did have 2 of the vaccines for COVID.  Did not get a booster.  He denies fever or chills.  No chest pain or shortness of breath.    Past Medical History:  Diagnosis Date  . Neuropathy     Patient Active Problem List   Diagnosis Date Noted  . Pneumonia 11/30/2020  . Intractable vomiting with nausea 11/29/2020  . Chest pain 05/01/2020  . CAP (community acquired pneumonia) 05/01/2020  . Hypokalemia 05/01/2020  . GERD (gastroesophageal reflux disease) 05/01/2020  . Nausea vomiting and diarrhea 05/01/2020  . Acute metabolic encephalopathy 05/01/2020    Past Surgical History:  Procedure Laterality Date  . HEMORRHOID SURGERY      Prior to Admission medications   Medication Sig Start Date End Date Taking? Authorizing Provider  cholecalciferol (VITAMIN D3) 25 MCG (1000 UNIT) tablet Take 1,000 Units by mouth daily.    [provider]  diphenhydrAMINE (BENADRYL) 25 MG tablet Take 25-50 mg by mouth daily as needed for allergies or itching.    [provider]  famotidine (PEPCID) 20 MG tablet Take 20 mg by mouth 2 (two) times daily as needed for heartburn or indigestion.    [provider]  HYDROcodone-acetaminophen (NORCO) 10-325 MG tablet Take 1 tablet by mouth every 4 (four) hours as needed for moderate pain or severe pain.    [provider]  ondansetron (ZOFRAN-ODT) 4 MG disintegrating  tablet Take 1 tablet (4 mg total) by mouth every 8 (eight) hours as needed for nausea or vomiting. 12/01/20   Danford, Earl Lites, MD  polyethylene glycol (MIRALAX / GLYCOLAX) 17 g packet Take 17 g by mouth daily. 12/01/20   Danford, Earl Lites, MD  sennosides-docusate sodium (SENOKOT-S) 8.6-50 MG tablet Take 2 tablets by mouth daily as needed for constipation.    [provider]  therapeutic multivitamin-minerals (THERAGRAN-M) tablet Take 1 tablet by mouth daily.    [provider]    Allergies Patient has no known allergies.  Family History  Problem Relation Age of Onset  . Diabetes Mellitus II Sister   . Liver disease Sister     Social History Social History   Tobacco Use  . Smoking status: Never Smoker  . Smokeless tobacco: Never Used  Substance Use Topics  . Alcohol use: No  . Drug use: Never    Review of Systems  Constitutional: No fever/chills Eyes: No visual changes. ENT: No sore throat. Respiratory: Positive cough Cardiovascular: Denies chest pain Gastrointestinal: Denies abdominal pain Genitourinary: Negative for dysuria. Musculoskeletal: Negative for back pain. Skin: Negative for rash. Psychiatric: no mood changes,     ____________________________________________   PHYSICAL EXAM:  VITAL SIGNS: ED Triage Vitals  Enc Vitals Group     BP 02/03/21 1108 111/72     Pulse Rate 02/03/21 1108 94     Resp 02/03/21 1108 18     Temp 02/03/21 1108 99 F (37.2 C)  Temp Source 02/03/21 1108 Oral     SpO2 02/03/21 1108 95 %     Weight 02/03/21 1109 105 lb (47.6 kg)     Height 02/03/21 1109 5\' 4"  (1.626 m)     Head Circumference --      Peak Flow --      Pain Score 02/03/21 1108 8     Pain Loc --      Pain Edu? --      Excl. in GC? --     Constitutional: Alert and oriented. Well appearing and in no acute distress. Eyes: Conjunctivae are normal.  Head: Atraumatic. Nose: No congestion/rhinnorhea. Mouth/Throat: Mucous membranes are  moist.   Neck:  supple no lymphadenopathy noted Cardiovascular: Normal rate, regular rhythm. Heart sounds are normal Respiratory: Normal respiratory effort.  No retractions, lungs c t a, cough is dry Abd: soft nontender bs normal all 4 quad GU: deferred Musculoskeletal: FROM all extremities, warm and well perfused Neurologic:  Normal speech and language.  Cranial nerves II through XII grossly intact Skin:  Skin is warm, dry and intact. No rash noted. Psychiatric: Mood and affect are normal. Speech and behavior are normal.  ____________________________________________   LABS (all labs ordered are listed, but only abnormal results are displayed)  Labs Reviewed  RESP PANEL BY RT-PCR (FLU A&B, COVID) [id number]   ____________________________________________   ____________________________________________  RADIOLOGY  Chest x-ray  ____________________________________________   PROCEDURES  Procedure(s) performed: No  Procedures    ____________________________________________   INITIAL IMPRESSION / ASSESSMENT AND PLAN / ED COURSE  Pertinent labs & imaging results that were available during my care of the patient were reviewed by me and considered in my medical decision making (see chart for details).   Patient 65 year old male presents with headache and COVID exposure.  See HPI.  Physical exam shows patient per stable.  No strokelike  Chest x-ray, COVID test, patient was given Vicodin for pain   Patient notified nursing staff that he has family member having an emergency.  He needs to leave immediately.  He states he will look at his results of all my chart.  He was in stable condition at this time the left  Drew Holt was evaluated in Emergency Department on 02/03/2021 for the symptoms described in the history of present illness. He was evaluated in the context of the global COVID-19 pandemic, which necessitated consideration that the patient might be at risk for infection  with the SARS-CoV-2 virus that causes COVID-19. Institutional protocols and algorithms that pertain to the evaluation of patients at risk for COVID-19 are in a state of rapid change based on information released by regulatory bodies including the CDC and federal and state organizations. These policies and algorithms were followed during the patient's care in the ED.    As part of my medical decision making, I reviewed the following data within the electronic MEDICAL RECORD NUMBER Nursing notes reviewed and incorporated, Labs reviewed , Old chart reviewed, Notes from prior ED visits and [place] Controlled Substance Database  ____________________________________________   FINAL CLINICAL IMPRESSION(S) / ED DIAGNOSES  Final diagnoses:  Suspected COVID-19 virus infection      NEW MEDICATIONS STARTED DURING THIS VISIT:  Discharge Medication List as of 02/03/2021 12:12 PM       Note:  This document was prepared using Dragon voice recognition software and may include unintentional dictation errors.    02/05/2021, PA-C 02/03/21 1213    02/05/21, MD 02/03/21 1218

## 2021-02-03 NOTE — ED Notes (Signed)
States he needed to leave   States he has a family emergency

## 2021-02-04 ENCOUNTER — Telehealth: Payer: Self-pay | Admitting: Unknown Physician Specialty

## 2021-02-04 NOTE — Telephone Encounter (Signed)
Called to discuss with patient about COVID-19 symptoms and the use of one of the available treatments for those with mild to moderate Covid symptoms and at a high risk of hospitalization.  Pt appears to qualify for outpatient treatment due to co-morbid conditions and/or a member of an at-risk group in accordance with the FDA Emergency Use Authorization.    Symptom onset: 5/17 Vaccinated: yes Booster? no  Unable to reach pt - voicemail not set up  [employer]

## 2021-09-26 IMAGING — CT CT ANGIO CHEST
2 of 6 series · 18 of 46 positions shown · IV contrast (APPLIED)
Comparison: None.

CLINICAL DATA: Chest pain and positive D-dimer

EXAM:
CT ANGIOGRAPHY CHEST WITH CONTRAST
TECHNIQUE: Multidetector CT imaging of the chest was performed using the
standard protocol during bolus administration of intravenous
contrast. Multiplanar CT image reconstructions and MIPs were
obtained to evaluate the vascular anatomy.
CONTRAST:  75mL OMNIPAQUE IOHEXOL 350 MG/ML SOLN

[Series 5: thins · axial · 0.55mm/px · z∈[-291,-31]mm · 15 of 286 slices shown]
[im 13/286  lung]
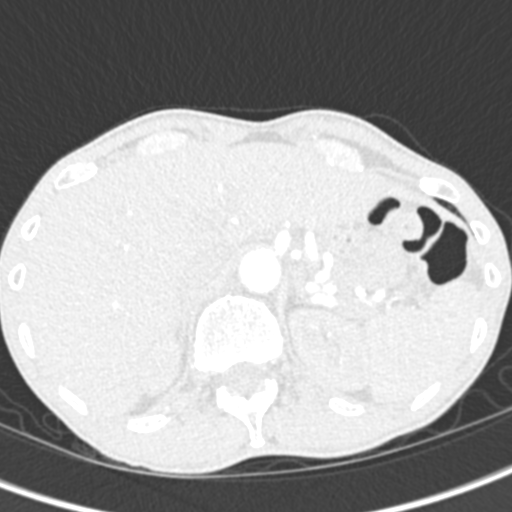
[im 38/286  soft-tissue]
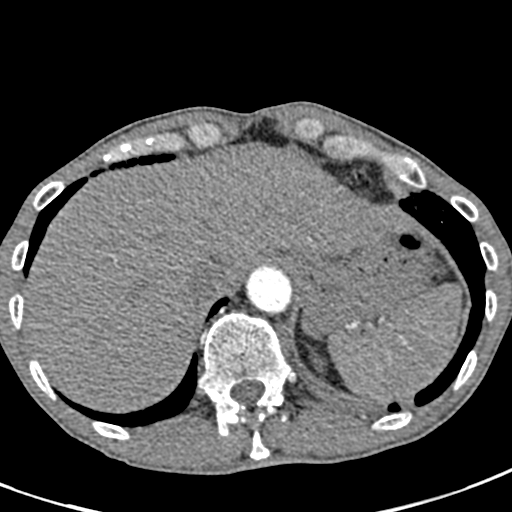
[im 50/286  lung]
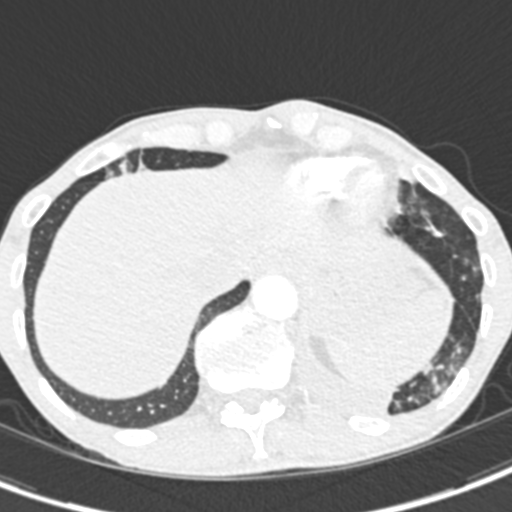
[im 75/286  soft-tissue]
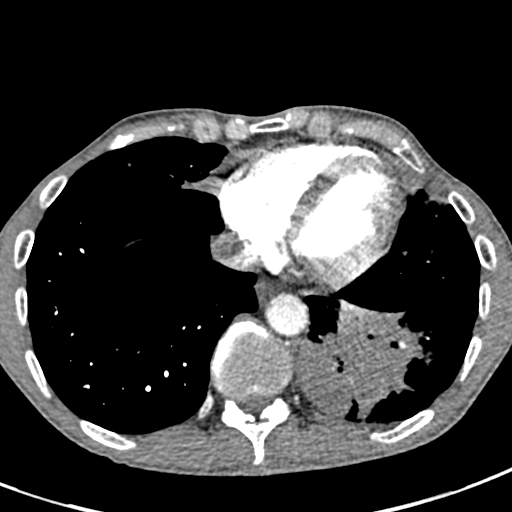
[im 87/286  lung]
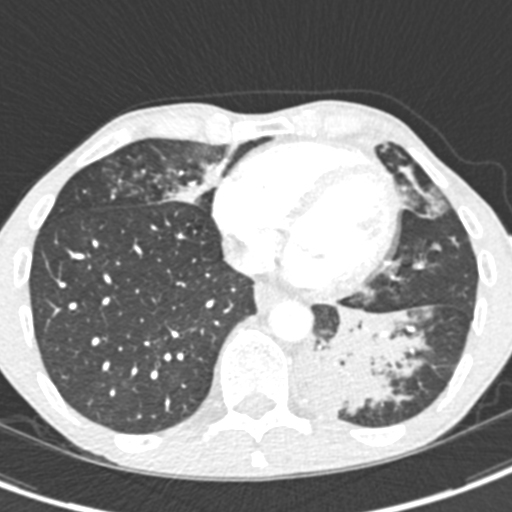
[im 112/286  soft-tissue]
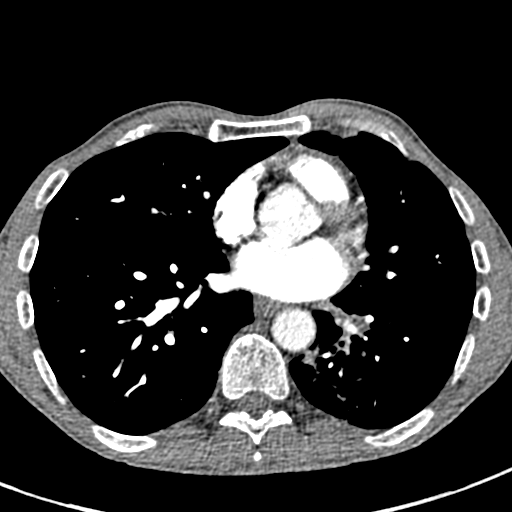
[im 124/286  lung]
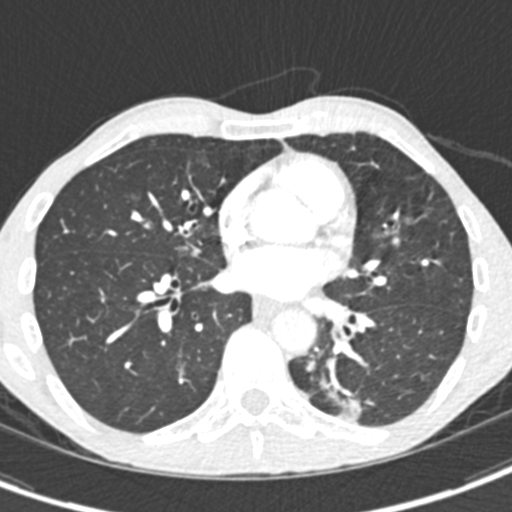
[im 149/286  soft-tissue]
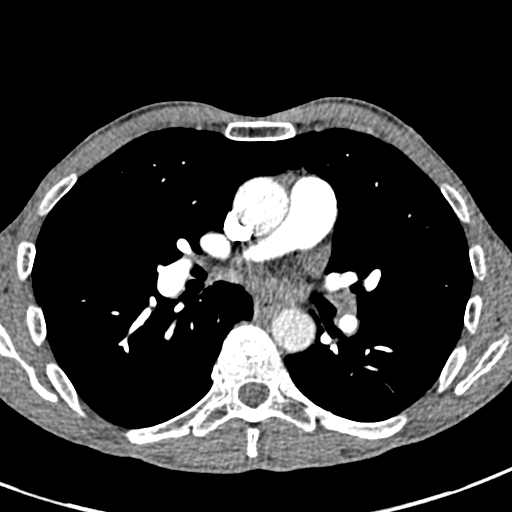
[im 162/286  lung]
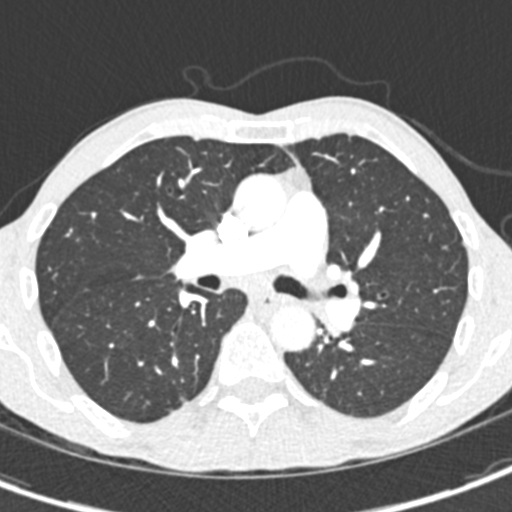
[im 174/286  soft-tissue]
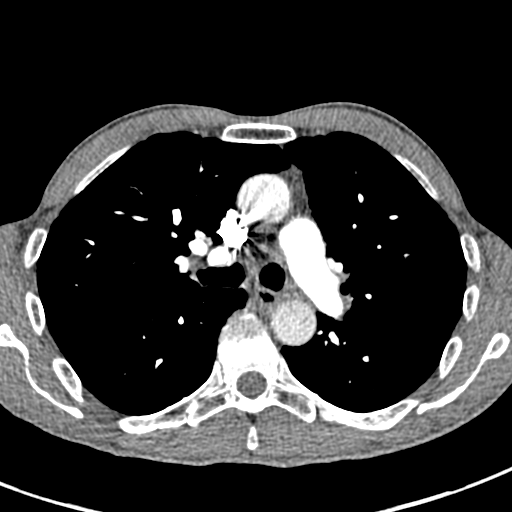
[im 199/286  lung]
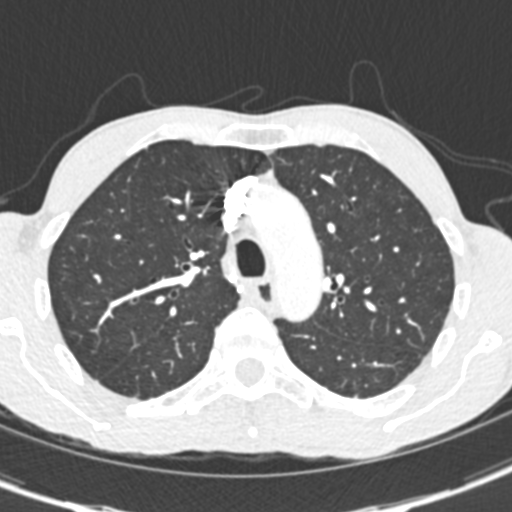
[im 211/286  soft-tissue]
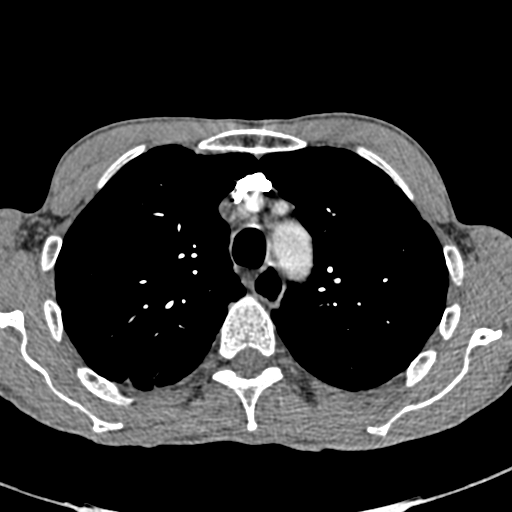
[im 236/286  lung]
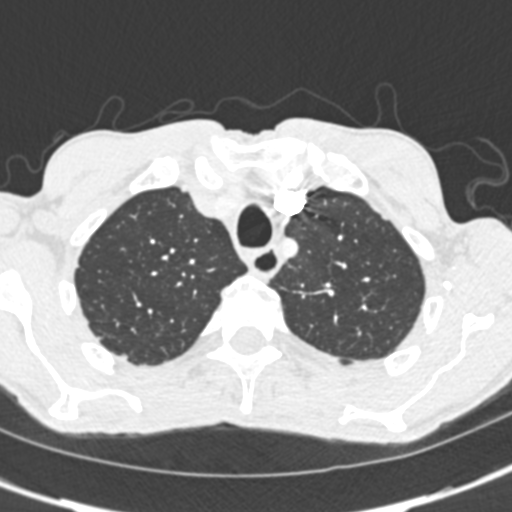
[im 248/286  soft-tissue]
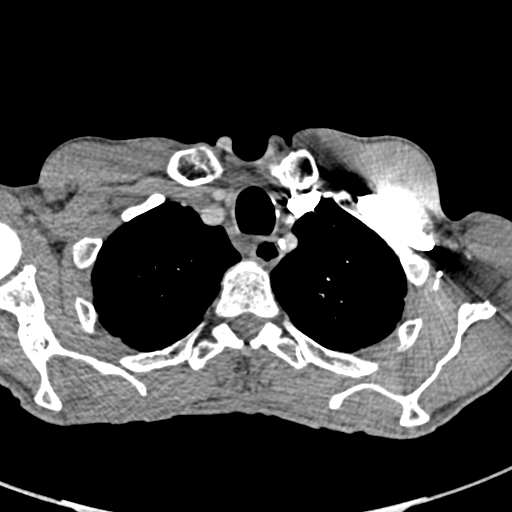
[im 273/286  lung]
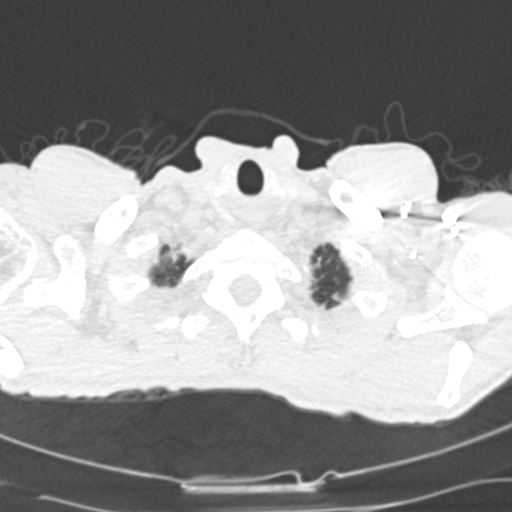

[Series 7: coronal mpr · coronal · 0.52mm/px · 3 of 67 slices shown]
[im 17/67  soft-tissue]
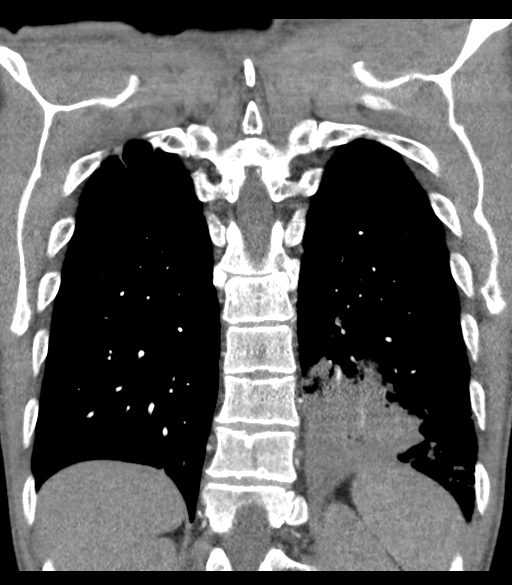
[im 34/67  soft-tissue]
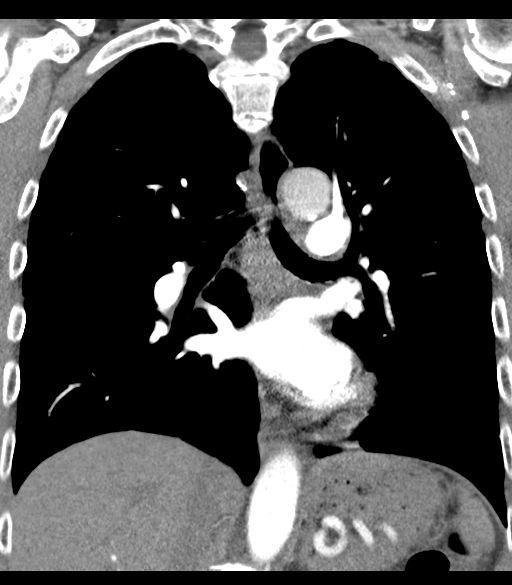
[im 50/67  soft-tissue]
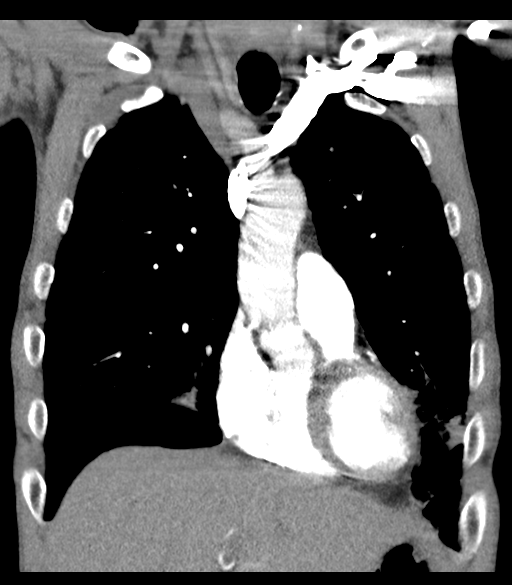

[18 of 46 positions shown; findings below may reference images not displayed]

FINDINGS: Cardiovascular: There is a optimal opacification of the pulmonary
arteries. There is no central,segmental, or subsegmental filling
defects within the pulmonary arteries. The heart is normal in size.
No pericardial effusion or thickening. No evidence right heart
strain. There is normal three-vessel brachiocephalic anatomy without
proximal stenosis. The thoracic aorta is normal in appearance.

Mediastinum/Nodes: No hilar, mediastinal, or axillary adenopathy.
Thyroid gland, trachea, and esophagus demonstrate no significant
findings.

Lungs/Pleura: There is multifocal area of rounded airspace
consolidation seen at the posterior right lung base with air
bronchograms. Tree-in-bud opacities seen within the right middle
lobe and anterior left upper lobe.

Upper Abdomen: No acute abnormalities present in the visualized
portions of the upper abdomen.

Musculoskeletal: No chest wall abnormality. No acute or significant
osseous findings.

Review of the MIP images confirms the above findings.
IMPRESSION: No central segmental or subsegmental pulmonary embolism. Multifocal
areas airspace consolidation and tree-in-bud opacities, likely
consistent with multifocal pneumonia.

## 2022-01-11 ENCOUNTER — Emergency Department
Admission: EM | Admit: 2022-01-11 | Discharge: 2022-01-11 | Disposition: A | Discharge status: Home / Self Care | Payer: Medicare HMO | Attending: Emergency Medicine | Admitting: Emergency Medicine

## 2022-01-11 ENCOUNTER — Encounter: Payer: Self-pay | Admitting: Emergency Medicine

## 2022-01-11 ENCOUNTER — Other Ambulatory Visit: Payer: Self-pay

## 2022-01-11 DIAGNOSIS — R21 Rash and other nonspecific skin eruption: Secondary | ICD-10-CM | POA: Diagnosis present

## 2022-01-11 DIAGNOSIS — L539 Erythematous condition, unspecified: Secondary | ICD-10-CM | POA: Diagnosis not present

## 2022-01-11 MED ORDER — DOXYCYCLINE MONOHYDRATE 100 MG PO TABS: 100.0000 mg | ORAL_TABLET | Freq: Two times a day (BID) | ORAL | 0 refills | Status: AC

## 2022-01-11 MED ORDER — CEPHALEXIN 500 MG PO CAPS: 500.0000 mg | ORAL_CAPSULE | Freq: Four times a day (QID) | ORAL | 0 refills | Status: AC

## 2022-01-11 MED ORDER — MUPIROCIN 2 % EX OINT: 1.0000 "application " | TOPICAL_OINTMENT | Freq: Two times a day (BID) | CUTANEOUS | 0 refills | Status: AC

## 2022-01-11 NOTE — Discharge Instructions (Addendum)
Take Keflex 4 times daily for the next 7 days. ?Take Doxycycline twice daily for seven days.  ?Apply mupirocin twice daily. ?

## 2022-01-11 NOTE — ED Provider Notes (Signed)
? ?[facility] ?Provider Note ? ?Patient Contact: 7:27 PM (approximate) ? ? ?History  ? ?Rash ? ? ?HPI ? ?Drew Holt is a 66 y.o. male presents to the emergency department with an erythematous rash of patient's left neck, left upper extremity and the right aspect of his neck.  Patient states that he has been sleeping on an unfamiliar mattress and has been scratching at the affected areas.  He denies known tick bites. ? ?  ? ? ?Physical Exam  ? ?Triage Vital Signs: ?ED Triage Vitals  ?Enc Vitals Group  ?   BP 01/11/22 1716 125/81  ?   Pulse Rate 01/11/22 1716 78  ?   Resp 01/11/22 1716 20  ?   Temp 01/11/22 1716 98 ?F (36.7 ?C)  ?   Temp Source 01/11/22 1716 Oral  ?   SpO2 01/11/22 1716 99 %  ?   Weight 01/11/22 1715 112 lb (50.8 kg)  ?   Height 01/11/22 1715 5\' 4"  (1.626 m)  ?   Head Circumference --   ?   Peak Flow --   ?   Pain Score 01/11/22 1715 7  ?   Pain Loc --   ?   Pain Edu? --   ?   Excl. in GC? --   ? ? ?Most recent vital signs: ?Vitals:  ? 01/11/22 1716  ?BP: 125/81  ?Pulse: 78  ?Resp: 20  ?Temp: 98 ?F (36.7 ?C)  ?SpO2: 99%  ? ? ? ?General: Alert and in no acute distress. ?Eyes:  PERRL. EOMI. ?Head: No acute traumatic findings ?ENT: ?     Ears: Tms are pearly.  ?     Nose: No congestion/rhinnorhea. ?     Mouth/Throat: Mucous membranes are moist. ?Neck: No stridor. No cervical spine tenderness to palpation. ?Cardiovascular:  Good peripheral perfusion ?Respiratory: Normal respiratory effort without tachypnea or retractions. Lungs CTAB. Good air entry to the bases with no decreased or absent breath sounds. ?Gastrointestinal: Bowel sounds ?4 quadrants. Soft and nontender to palpation. No guarding or rigidity. No palpable masses. No distention. No CVA tenderness. ?Musculoskeletal: Full range of motion to all extremities.  ?Neurologic:  No gross focal neurologic deficits are appreciated.  ?Skin: Patient has erythematous, scabbing rash along neck and upper extremities. ?Other: ? ? ?ED  Results / Procedures / Treatments  ? ?Labs ?(all labs ordered are listed, but only abnormal results are displayed) ?Labs Reviewed - No data to display ? ? ? ? ? ?PROCEDURES: ? ?Critical Care performed: No ? ?Procedures ? ? ?MEDICATIONS ORDERED IN ED: ?Medications - No data to display ? ? ?IMPRESSION / MDM / ASSESSMENT AND PLAN / ED COURSE  ?I reviewed the triage vital signs and the nursing notes. ?             ?               ?Assessment and plan ?Rash ?66 year old male presents to the emergency department with a scabbing, erythematous rash of neck and left upper extremity. ? ?I do suspect that patient has a secondary staph infection from excoriation and will treat with both doxycycline and Keflex as well as topical mupirocin and have patient follow-up with primary care as needed. ? ?  ? ? ?FINAL CLINICAL IMPRESSION(S) / ED DIAGNOSES  ? ?Final diagnoses:  ?Rash  ? ? ? ?Rx / DC Orders  ? ?ED Discharge Orders   ? ?      Ordered  ?  cephALEXin (  KEFLEX) 500 MG capsule  4 times daily       ? 01/11/22 1843  ?  mupirocin ointment (BACTROBAN) 2 %  2 times daily       ? 01/11/22 1843  ?  doxycycline (ADOXA) 100 MG tablet  2 times daily       ? 01/11/22 1844  ? ?  ?  ? ?  ? ? ? ?Note:  This document was prepared using Dragon voice recognition software and may include unintentional dictation errors. ?  ?Orvil Feil, PA-C ?01/11/22 1931 ? ?  ?Minna Antis, MD ?01/11/22 2126 ? ?
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
## 2022-01-27 DIAGNOSIS — G8929 Other chronic pain: Secondary | ICD-10-CM | POA: Insufficient documentation

## 2023-06-30 ENCOUNTER — Other Ambulatory Visit: Payer: Self-pay

## 2023-06-30 ENCOUNTER — Emergency Department: Payer: Medicare HMO

## 2023-06-30 DIAGNOSIS — Z1152 Encounter for screening for COVID-19: Secondary | ICD-10-CM | POA: Insufficient documentation

## 2023-06-30 DIAGNOSIS — J188 Other pneumonia, unspecified organism: Secondary | ICD-10-CM | POA: Diagnosis not present

## 2023-06-30 DIAGNOSIS — K219 Gastro-esophageal reflux disease without esophagitis: Secondary | ICD-10-CM | POA: Diagnosis not present

## 2023-06-30 DIAGNOSIS — G9341 Metabolic encephalopathy: Secondary | ICD-10-CM | POA: Diagnosis not present

## 2023-06-30 DIAGNOSIS — M6281 Muscle weakness (generalized): Secondary | ICD-10-CM | POA: Insufficient documentation

## 2023-06-30 DIAGNOSIS — R109 Unspecified abdominal pain: Secondary | ICD-10-CM | POA: Insufficient documentation

## 2023-06-30 DIAGNOSIS — D649 Anemia, unspecified: Secondary | ICD-10-CM | POA: Insufficient documentation

## 2023-06-30 DIAGNOSIS — R262 Difficulty in walking, not elsewhere classified: Secondary | ICD-10-CM | POA: Diagnosis not present

## 2023-06-30 DIAGNOSIS — E876 Hypokalemia: Secondary | ICD-10-CM | POA: Diagnosis not present

## 2023-06-30 DIAGNOSIS — E440 Moderate protein-calorie malnutrition: Secondary | ICD-10-CM | POA: Diagnosis not present

## 2023-06-30 DIAGNOSIS — R0789 Other chest pain: Secondary | ICD-10-CM | POA: Diagnosis present

## 2023-06-30 DIAGNOSIS — R2689 Other abnormalities of gait and mobility: Secondary | ICD-10-CM | POA: Diagnosis not present

## 2023-06-30 LAB — BASIC METABOLIC PANEL
Anion gap: 13 (ref 5–15)
BUN: 17 mg/dL (ref 8–23)
CO2: 26 mmol/L (ref 22–32)
Calcium: 8.7 mg/dL — ABNORMAL LOW (ref 8.9–10.3)
Chloride: 97 mmol/L — ABNORMAL LOW (ref 98–111)
Creatinine, Ser: 1.2 mg/dL (ref 0.61–1.24)
GFR, Estimated: >60 mL/min (ref >60)
Glucose, Bld: 145 mg/dL — ABNORMAL HIGH (ref 70–99)
Potassium: 4.1 mmol/L (ref 3.5–5.1)
Sodium: 136 mmol/L (ref 135–145)

## 2023-06-30 LAB — CBC
HCT: 25.8 % — ABNORMAL LOW (ref 39.0–52.0)
Hemoglobin: 7.6 g/dL — ABNORMAL LOW (ref 13.0–17.0)
MCH: 27.1 pg (ref 26.0–34.0)
MCHC: 29.5 g/dL — ABNORMAL LOW (ref 30.0–36.0)
MCV: 92.1 fL (ref 80.0–100.0)
Platelets: 490 10*3/uL — ABNORMAL HIGH (ref 150–400)
RBC: 2.8 MIL/uL — ABNORMAL LOW (ref 4.22–5.81)
RDW: 14.3 % (ref 11.5–15.5)
WBC: 17.9 10*3/uL — ABNORMAL HIGH (ref 4.0–10.5)
nRBC: 0 % (ref 0.0–0.2)

## 2023-06-30 LAB — TROPONIN I (HIGH SENSITIVITY): Troponin I (High Sensitivity): 5 ng/L (ref <18)

## 2023-06-30 NOTE — ED Triage Notes (Signed)
Pt to ed from home via [facility] for CP x 1 hour. Pt is caox4, in no acute distress in triage. Coughing and spitting up stuff the last week.   97.7T 324mg  ASA 18 RAC 131/67 98%117HR  209BGL

## 2023-07-01 ENCOUNTER — Encounter: Payer: Self-pay | Admitting: Internal Medicine

## 2023-07-01 ENCOUNTER — Other Ambulatory Visit: Payer: Self-pay

## 2023-07-01 ENCOUNTER — Inpatient Hospital Stay: Payer: Medicare HMO

## 2023-07-01 ENCOUNTER — Observation Stay
Admission: EM | Admit: 2023-07-01 | Discharge: 2023-07-02 | Disposition: A | Discharge status: Home Health | Payer: Medicare HMO | Attending: Obstetrics and Gynecology | Admitting: Obstetrics and Gynecology

## 2023-07-01 DIAGNOSIS — E46 Unspecified protein-calorie malnutrition: Secondary | ICD-10-CM | POA: Insufficient documentation

## 2023-07-01 DIAGNOSIS — R531 Weakness: Secondary | ICD-10-CM | POA: Diagnosis not present

## 2023-07-01 DIAGNOSIS — D649 Anemia, unspecified: Secondary | ICD-10-CM

## 2023-07-01 DIAGNOSIS — E538 Deficiency of other specified B group vitamins: Secondary | ICD-10-CM | POA: Insufficient documentation

## 2023-07-01 DIAGNOSIS — J189 Pneumonia, unspecified organism: Secondary | ICD-10-CM | POA: Diagnosis present

## 2023-07-01 DIAGNOSIS — R2689 Other abnormalities of gait and mobility: Secondary | ICD-10-CM | POA: Insufficient documentation

## 2023-07-01 DIAGNOSIS — J188 Other pneumonia, unspecified organism: Secondary | ICD-10-CM | POA: Diagnosis present

## 2023-07-01 DIAGNOSIS — E43 Unspecified severe protein-calorie malnutrition: Secondary | ICD-10-CM | POA: Insufficient documentation

## 2023-07-01 DIAGNOSIS — R079 Chest pain, unspecified: Principal | ICD-10-CM | POA: Diagnosis present

## 2023-07-01 DIAGNOSIS — G6289 Other specified polyneuropathies: Secondary | ICD-10-CM | POA: Diagnosis present

## 2023-07-01 LAB — RESPIRATORY PANEL BY PCR

## 2023-07-01 LAB — RESP PANEL BY RT-PCR (RSV, FLU A&B, COVID)  RVPGX2
Influenza A by PCR: NEGATIVE
Influenza B by PCR: NEGATIVE
Resp Syncytial Virus by PCR: NEGATIVE
SARS Coronavirus 2 by RT PCR: NEGATIVE

## 2023-07-01 LAB — IRON AND TIBC
Iron: 10 ug/dL — ABNORMAL LOW (ref 45–182)
Saturation Ratios: 6 % — ABNORMAL LOW (ref 17.9–39.5)
TIBC: 172 ug/dL — ABNORMAL LOW (ref 250–450)
UIBC: 162 ug/dL

## 2023-07-01 LAB — CBC
HCT: 25.7 % — ABNORMAL LOW (ref 39.0–52.0)
Hemoglobin: 7.5 g/dL — ABNORMAL LOW (ref 13.0–17.0)
MCH: 27.1 pg (ref 26.0–34.0)
MCHC: 29.2 g/dL — ABNORMAL LOW (ref 30.0–36.0)
MCV: 92.8 fL (ref 80.0–100.0)
Platelets: 426 10*3/uL — ABNORMAL HIGH (ref 150–400)
RBC: 2.77 MIL/uL — ABNORMAL LOW (ref 4.22–5.81)
RDW: 14.4 % (ref 11.5–15.5)
WBC: 13.2 10*3/uL — ABNORMAL HIGH (ref 4.0–10.5)
nRBC: 0 % (ref 0.0–0.2)

## 2023-07-01 LAB — TROPONIN I (HIGH SENSITIVITY)
Troponin I (High Sensitivity): 7 ng/L (ref <18)
Troponin I (High Sensitivity): 9 ng/L (ref <18)

## 2023-07-01 LAB — LACTIC ACID, PLASMA
Lactic Acid, Venous: 1.1 mmol/L (ref 0.5–1.9)
Lactic Acid, Venous: 1.4 mmol/L (ref 0.5–1.9)

## 2023-07-01 LAB — CREATININE, SERUM
Creatinine, Ser: 1.08 mg/dL (ref 0.61–1.24)
GFR, Estimated: >60 mL/min (ref >60)

## 2023-07-01 LAB — D-DIMER, QUANTITATIVE: D-Dimer, Quant: 1.69 ug{FEU}/mL — ABNORMAL HIGH (ref 0.00–0.50)

## 2023-07-01 LAB — HIV ANTIBODY (ROUTINE TESTING W REFLEX): HIV Screen 4th Generation wRfx: NONREACTIVE

## 2023-07-01 LAB — FOLATE: Folate: 8 ng/mL (ref >5.9)

## 2023-07-01 LAB — FERRITIN: Ferritin: 573 ng/mL — ABNORMAL HIGH (ref 24–336)

## 2023-07-01 LAB — VITAMIN B12: Vitamin B-12: 197 pg/mL (ref 180–914)

## 2023-07-01 LAB — STREP PNEUMONIAE URINARY ANTIGEN: Strep Pneumo Urinary Antigen: NEGATIVE

## 2023-07-01 MED ORDER — ACETAMINOPHEN 650 MG RE SUPP: 650.0000 mg | Freq: Four times a day (QID) | RECTAL | Status: DC | PRN

## 2023-07-01 MED ORDER — HYDROCODONE-ACETAMINOPHEN 10-325 MG PO TABS
1.0000 | ORAL_TABLET | ORAL | Status: DC | PRN
  Administered 2023-07-01 – 2023-07-02 (×4): 1 via ORAL
  Filled 2023-07-01 (×5): qty 1

## 2023-07-01 MED ORDER — SODIUM CHLORIDE 0.9 % IV SOLN
1.0000 g | Freq: Once | INTRAVENOUS | Status: AC
  Administered 2023-07-01: 1 g via INTRAVENOUS
  Filled 2023-07-01: qty 10

## 2023-07-01 MED ORDER — ENOXAPARIN SODIUM 30 MG/0.3ML IJ SOSY: 30.0000 mg | PREFILLED_SYRINGE | INTRAMUSCULAR | Status: DC

## 2023-07-01 MED ORDER — ENSURE ENLIVE PO LIQD
237.0000 mL | Freq: Two times a day (BID) | ORAL | Status: DC
  Administered 2023-07-02: 237 mL via ORAL

## 2023-07-01 MED ORDER — ONDANSETRON HCL 4 MG/2ML IJ SOLN
4.0000 mg | Freq: Four times a day (QID) | INTRAMUSCULAR | Status: DC | PRN
  Administered 2023-07-01 – 2023-07-02 (×3): 4 mg via INTRAVENOUS
  Filled 2023-07-01 (×3): qty 2

## 2023-07-01 MED ORDER — KETOROLAC TROMETHAMINE 15 MG/ML IJ SOLN
15.0000 mg | Freq: Once | INTRAMUSCULAR | Status: AC
  Administered 2023-07-01: 15 mg via INTRAVENOUS
  Filled 2023-07-01: qty 1

## 2023-07-01 MED ORDER — IOHEXOL 350 MG/ML SOLN
75.0000 mL | Freq: Once | INTRAVENOUS | Status: AC | PRN
  Administered 2023-07-01: 75 mL via INTRAVENOUS

## 2023-07-01 MED ORDER — ONDANSETRON HCL 4 MG PO TABS
4.0000 mg | ORAL_TABLET | Freq: Four times a day (QID) | ORAL | Status: DC | PRN
  Filled 2023-07-01: qty 1

## 2023-07-01 MED ORDER — IPRATROPIUM-ALBUTEROL 0.5-2.5 (3) MG/3ML IN SOLN
3.0000 mL | Freq: Once | RESPIRATORY_TRACT | Status: AC
  Administered 2023-07-01: 3 mL via RESPIRATORY_TRACT
  Filled 2023-07-01: qty 3

## 2023-07-01 MED ORDER — ALBUTEROL SULFATE (2.5 MG/3ML) 0.083% IN NEBU
2.5000 mg | INHALATION_SOLUTION | RESPIRATORY_TRACT | Status: DC | PRN
  Administered 2023-07-01: 2.5 mg via RESPIRATORY_TRACT
  Filled 2023-07-01 (×2): qty 3

## 2023-07-01 MED ORDER — SODIUM CHLORIDE 0.9 % IV BOLUS
1000.0000 mL | Freq: Once | INTRAVENOUS | Status: AC
  Administered 2023-07-01: 1000 mL via INTRAVENOUS

## 2023-07-01 MED ORDER — TRAZODONE HCL 50 MG PO TABS
50.0000 mg | ORAL_TABLET | Freq: Every evening | ORAL | Status: DC | PRN
  Administered 2023-07-01: 50 mg via ORAL
  Filled 2023-07-01: qty 1

## 2023-07-01 MED ORDER — SODIUM CHLORIDE 0.9 % IV SOLN
500.0000 mg | Freq: Once | INTRAVENOUS | Status: AC
  Administered 2023-07-01: 500 mg via INTRAVENOUS
  Filled 2023-07-01: qty 5

## 2023-07-01 MED ORDER — SODIUM CHLORIDE 0.9 % IV SOLN
2.0000 g | INTRAVENOUS | Status: DC
  Administered 2023-07-02: 2 g via INTRAVENOUS
  Filled 2023-07-01: qty 20

## 2023-07-01 MED ORDER — ENOXAPARIN SODIUM 40 MG/0.4ML IJ SOSY: 40.0000 mg | PREFILLED_SYRINGE | INTRAMUSCULAR | Status: DC

## 2023-07-01 MED ORDER — MORPHINE SULFATE (PF) 4 MG/ML IV SOLN
4.0000 mg | Freq: Once | INTRAVENOUS | Status: AC
  Administered 2023-07-01: 4 mg via INTRAVENOUS
  Filled 2023-07-01: qty 1

## 2023-07-01 MED ORDER — FAMOTIDINE 20 MG PO TABS: 20.0000 mg | ORAL_TABLET | Freq: Two times a day (BID) | ORAL | Status: DC | PRN

## 2023-07-01 MED ORDER — SODIUM CHLORIDE 0.9 % IV SOLN
500.0000 mg | INTRAVENOUS | Status: DC
  Administered 2023-07-02: 500 mg via INTRAVENOUS
  Filled 2023-07-01: qty 5

## 2023-07-01 MED ORDER — ACETAMINOPHEN 325 MG PO TABS: 650.0000 mg | ORAL_TABLET | Freq: Four times a day (QID) | ORAL | Status: DC | PRN

## 2023-07-01 MED ORDER — SODIUM CHLORIDE 0.9 % IV SOLN: INTRAVENOUS | Status: DC

## 2023-07-01 MED ORDER — GUAIFENESIN ER 600 MG PO TB12
600.0000 mg | ORAL_TABLET | Freq: Two times a day (BID) | ORAL | Status: DC
  Administered 2023-07-01 – 2023-07-02 (×3): 600 mg via ORAL
  Filled 2023-07-01 (×3): qty 1

## 2023-07-01 MED ORDER — MORPHINE SULFATE (PF) 2 MG/ML IV SOLN
2.0000 mg | INTRAVENOUS | Status: AC | PRN
  Administered 2023-07-01: 2 mg via INTRAVENOUS
  Filled 2023-07-01: qty 1

## 2023-07-01 MED ORDER — KETOROLAC TROMETHAMINE 15 MG/ML IJ SOLN
15.0000 mg | Freq: Four times a day (QID) | INTRAMUSCULAR | Status: DC | PRN
  Administered 2023-07-01 – 2023-07-02 (×2): 15 mg via INTRAVENOUS
  Filled 2023-07-01 (×2): qty 1

## 2023-07-01 MED ORDER — MORPHINE SULFATE (PF) 2 MG/ML IV SOLN: 2.0000 mg | INTRAVENOUS | Status: DC | PRN

## 2023-07-01 MED ORDER — HYDROCOD POLI-CHLORPHE POLI ER 10-8 MG/5ML PO SUER
5.0000 mL | Freq: Once | ORAL | Status: AC
  Administered 2023-07-01: 5 mL via ORAL
  Filled 2023-07-01: qty 5

## 2023-07-01 MED ORDER — MORPHINE SULFATE (PF) 2 MG/ML IV SOLN: 2.0000 mg | Freq: Once | INTRAVENOUS | Status: DC

## 2023-07-01 MED ORDER — PANTOPRAZOLE SODIUM 40 MG IV SOLR
40.0000 mg | Freq: Two times a day (BID) | INTRAVENOUS | Status: DC
  Administered 2023-07-01 – 2023-07-02 (×3): 40 mg via INTRAVENOUS
  Filled 2023-07-01 (×3): qty 10

## 2023-07-01 NOTE — Progress Notes (Signed)
PHARMACIST - PHYSICIAN COMMUNICATION  CONCERNING:  Enoxaparin (Lovenox) for DVT Prophylaxis    RECOMMENDATION: Patient was prescribed enoxaprin 40mg  q24 hours for VTE prophylaxis.   Filed Weights   07/01/23 0548  Weight: 44.9 kg (98 lb 15.8 oz)    Body mass index is 16.99 kg/m.  Estimated Creatinine Clearance: 42.2 mL/min (by C-G formula based on SCr of 1.08 mg/dL).    Patient is candidate for enoxaparin 30mg  every 24 hours based on CrCl <28ml/min or Weight <45kg  DESCRIPTION: Pharmacy has adjusted enoxaparin dose per [facility] policy.  Patient is now receiving enoxaparin 30 mg every 24 hours   Gardner Candle, PharmD, BCPS Clinical Pharmacist 07/01/2023 9:51 AM

## 2023-07-01 NOTE — ED Notes (Signed)
Pt given urinal and informed of needing sample. Pt also given sputum collection cup and informed of need sample of his sputum as well.   Pt's IV dressing changed and site cleaned up. Pt placed back on monitor with new electrodes.

## 2023-07-01 NOTE — Progress Notes (Signed)
Pt found by other RN in room 129 from ED. Pt states he had been there for "some time" and no one had entered his room. Unit staff was not notified of pt's arrival to room. Pt's needs cared for.  Pt moved to room 117 for closer observation by staff. VS obtained. MD Wouk and Rapid RN notified and at bedside. New orders carried out.    07/01/23 1721  Assess: MEWS Score  Temp 98.4 F (36.9 C)  BP (!) 124/93  MAP (mmHg) 104  Pulse Rate (!) 123  Resp (!) 32  Level of Consciousness Alert  SpO2 97 %  O2 Device Room Air  Assess: MEWS Score  MEWS Temp 0  MEWS Systolic 0  MEWS Pulse 2  MEWS RR 2  MEWS LOC 0  MEWS Score 4  MEWS Score Color Red  Assess: if the MEWS score is Yellow or Red  Were vital signs accurate and taken at a resting state? Yes  Does the patient meet 2 or more of the SIRS criteria? Yes  Does the patient have a confirmed or suspected source of infection? Yes  Notify: Charge Nurse/RN  Name of Charge Nurse/RN Notified Robyn, RN  Provider Notification  Provider Name/Title Wouk MD  Date Provider Notified 07/01/23  Time Provider Notified 1730  Method of Notification Page  Notification Reason Other (Comment) (Red MEWS)  Provider response See new orders;At bedside  Date of Provider Response 07/01/23  Time of Provider Response 1745  Notify: Rapid Response  Name of Rapid Response RN Notified Katie, RN  Date Rapid Response Notified 07/01/23  Time Rapid Response Notified 1730  Assess: SIRS CRITERIA  SIRS Temperature  0  SIRS Pulse 1  SIRS Respirations  1  SIRS WBC 0  SIRS Score Sum  2

## 2023-07-01 NOTE — Progress Notes (Signed)
Stopped due to increased HR

## 2023-07-01 NOTE — IPAL (Signed)
  Interdisciplinary Goals of Care Family Meeting   Date carried out: 07/01/2023  Location of the meeting: Bedside  Member's involved: Physician  Durable Power of Attorney or acting medical decision maker: patient    Discussion: We discussed goals of care for [employer] .   I have reviewed medical records including EPIC notes, labs and imaging, met and assessed the patient  to discuss major active diagnoses, plan of care, natural trajectory, prognosis, GOC, EOL wishes, disposition and options including Full code/DNI/DNR and the concept of comfort care if DNR is elected. Questions and concerns were addressed. They are  in agreement to continue current plan of care . Election for DNR/DNI status.   Code status:   Code Status: Limited: Do not attempt resuscitation (DNR) -DNR-LIMITED -Do Not Intubate/DNI    Disposition: Continue current acute care  Time spent for the meeting: 30    Andris Baumann, MD  07/01/2023, 5:39 AM

## 2023-07-01 NOTE — Assessment & Plan Note (Addendum)
SIRS/possible sepsis SIRS criteria include tachycardia and tachypnea, leukocytosis Rocephin and azithromycin Will get nasal viral swab, sputum culture Antitussives, DuoNebs as needed Incentive spirometer As needed O2 Speech therapy eval, patient likely increased aspiration risk

## 2023-07-01 NOTE — Progress Notes (Signed)
       CROSS COVER NOTE  NAME: Drew Holt MRN: [id number] DOB : August 23, 1956    Concern as stated by nurse / staff   Patient is asking for med for sleep. He has been yelling all day today and saying he is in pain despite getting Norco. His pain is in his legs and back and he does have neuropathy. He is rating the pain at a 10/10.      Pertinent findings on chart review:   Assessment and  Interventions   Assessment: Acute on chronic pain  Plan: Continue home hydrocodone 10/325 every 4 Toradol 15 mg every 6 as needed severe pain up to 5 doses Considered Neurontin but given sensory polyneuropathy, unsure whether this might aggravate symptoms so will defer Trazodone as needed for sleep

## 2023-07-01 NOTE — ED Notes (Signed)
Pt taken to CT.

## 2023-07-01 NOTE — Assessment & Plan Note (Signed)
Right-sided chest pain (lateral rib cage) likely related to pneumonia Pain control Can consider additional imaging if persistent
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
## 2023-07-01 NOTE — Evaluation (Signed)
Physical Therapy Evaluation Patient Details Name: Drew Holt MRN: [id number] DOB: Jan 15, 1956 Today's Date: 07/01/2023  History of Present Illness  Drew Holt is a 67 y.o. 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  Admitted with PNA   Clinical Impression  Patient received in bed sitting up (long sitting). Patient looking for his remote so he can watch football. He is difficult to understand.  Patient moving well and independently in bed, but declines attempting to stand due to neuropathy and states he doesn't do much moving at baseline. Patient states he has a bedside commode right next to his bed at home so he at least pivots, but would not attempt with me this session. He will continue to benefit from skilled PT to determine his mobility level and if he is able to return home with daughter. I also attempted to call her, after phone ringing several times call was cut off.             If plan is discharge home, recommend the following: A lot of help with walking and/or transfers;A little help with bathing/dressing/bathroom;Assist for transportation;Help with stairs or ramp for entrance   Can travel by private vehicle        Equipment Recommendations None recommended by PT  Recommendations for Other Services       Functional Status Assessment Patient has had a recent decline in their functional status and demonstrates the ability to make significant improvements in function in a reasonable and predictable amount of time.     Precautions / Restrictions Precautions Precautions: Fall Restrictions Weight Bearing Restrictions: No      Mobility  Bed  Mobility Overal bed mobility: Independent                  Transfers                   General transfer comment: patient declined attempting to stand, states he has neuropathy. Reports he does not do much at baseline as far as standing or walking    Ambulation/Gait               General Gait Details: declined attempting  Stairs            Wheelchair Mobility     Tilt Bed    Modified Rankin (Stroke Patients Only)       Balance                                             Pertinent Vitals/Pain Pain Assessment Pain Assessment: No/denies pain    Home Living Family/patient expects to be discharged to:: Private residence Living Arrangements: Children Available Help at Discharge: Family;Available PRN/intermittently             Home Equipment: BSC/3in1      Prior Function Prior Level of Function : Independent/Modified Independent             Mobility Comments: Patient states he does not do much at baseline. Gets up to [facility] right next to bed ADLs Comments: lives with his  daughter- attempted to call her     Extremity/Trunk Assessment   Upper Extremity Assessment Upper Extremity Assessment: Overall WFL for tasks assessed    Lower Extremity Assessment Lower Extremity Assessment: Overall WFL for tasks assessed    Cervical / Trunk Assessment Cervical / Trunk Assessment: Kyphotic  Communication   Communication Communication: Difficulty communicating thoughts/reduced clarity of speech;Other (comment) (difficult to understand) Cueing Techniques: Verbal cues  Cognition Arousal: Alert Behavior During Therapy: WFL for tasks assessed/performed Overall Cognitive Status: Within Functional Limits for tasks assessed                                 General Comments: asking for TV remote so he could watch football        General Comments      Exercises     Assessment/Plan    PT Assessment Patient  needs continued PT services  PT Problem List Decreased strength;Decreased activity tolerance;Decreased balance;Decreased mobility       PT Treatment Interventions Functional mobility training;Therapeutic activities;Gait training;Patient/family education    PT Goals (Current goals can be found in the Care Plan section)  Acute Rehab PT Goals Patient Stated Goal: none stated PT Goal Formulation: Patient unable to participate in goal setting Time For Goal Achievement: 07/15/23 Potential to Achieve Goals: Fair    Frequency Min 1X/week     Co-evaluation               AM-PAC PT "6 Clicks" Mobility  Outcome Measure Help needed turning from your back to your side while in a flat bed without using bedrails?: None Help needed moving from lying on your back to sitting on the side of a flat bed without using bedrails?: None Help needed moving to and from a bed to a chair (including a wheelchair)?: A Lot Help needed standing up from a chair using your arms (e.g., wheelchair or bedside chair)?: A Lot Help needed to walk in hospital room?: A Lot Help needed climbing 3-5 steps with a railing? : Total 6 Click Score: 15    End of Session   Activity Tolerance: Patient tolerated treatment well Patient left: in bed;with call bell/phone within reach Nurse Communication: Mobility status;Other (comment) (patient requesting nausea medication) PT Visit Diagnosis: Other abnormalities of gait and mobility (R26.89);Muscle weakness (generalized) (M62.81);Difficulty in walking, not elsewhere classified (R26.2)    Time: 1610-9604 PT Time Calculation (min) (ACUTE ONLY): 11 min   Charges:   PT Evaluation $PT Eval Low Complexity: 1 Low   PT General Charges $$ ACUTE PT VISIT: 1 Visit         Chace Bisch, PT, GCS 07/01/23,2:18 PM

## 2023-07-01 NOTE — Assessment & Plan Note (Signed)
Abnormality of gait due to impairment of balance On opiates for pain Fall precautions Consider PT eval

## 2023-07-01 NOTE — Progress Notes (Signed)
PROGRESS NOTE    Drew Holt  JWJ:[id number] DOB: 03-01-1956 DOA: 07/01/2023 PCP: Etheleen Nicks, NP     Brief Narrative:   From admission h and p  Drew Holt is a 67 y.o. 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  Denies fever chills, shortness of breath.  States the rib cage pain is the most bothersome thing for him.  He has had no nausea or vomiting, belly pain, diarrhea   Assessment & Plan:   Principal Problem:   Multifocal pneumonia Active Problems:   Chest pain   Diffuse sensory axonal peripheral polyneuropathy   Abnormality of gait due to impairment of balance   Protein-calorie malnutrition, severe (HCC)  # CAP # Sepsis Two weeks intermittent cough, here multifocal pna seen on cxr, mild tachycardia and leukocytosis. No dyspnea or o2 requirement. Covid neg - continue ceftriaxone/azithromcyin - f/u hiv, urine antigens, sputum culture, rvp  # Chest pain Intermittent, likely 2/2 above process. No overt ischemic changes on ekg - f/u trop, dimer - treat as above  # Anemia, normocytic Severe, with hgb in the 7s, was 11.4 earlier this year. Denies melena/hematochezia or other bleeding, does endorse daily nsaids (several BC powders). Denies hx GI or other bleed, no recent endoluminal eval - f/u iron panel, b12/folate - npo for now - start ppi IV - GI advises holding of on EGD unless decompensates, given risk of anesthesia with active pneumonia.   # Severe malnutrition # Failure to thrive Patient chalks this up to "always being thin" and his neuropathy - RD consult - will plan pan-scan after results of dimer  # Idiopathic sensory  peripheral neuropathy Patient says he's followed yearly at [facility] but from what I can tell last visit was in 2018. Mostly bedbound because of this. - will ask neurology to evaluate - PT consult  # Chronic pain - home norco  DVT prophylaxis: SCDs for now Code Status: full Family Communication: none at bedside. No answer when daughter called  Level of care: Telemetry Medical Status is: Inpatient Remains inpatient appropriate because: need for inpatient w/u    Consultants:  neuro  Procedures: None thus far  Antimicrobials:  Ceftriaxone/azithromycin    Subjective: Reports intermittent cough with associated chest pain  Objective: Vitals:   07/01/23 0600 07/01/23 0620 07/01/23 0630 07/01/23 0900  BP: 135/87  (!) 150/73 114/81  Pulse: 99  (!) 102 94  Resp: 19  16   Temp:  98.7 F (37.1 C)    TempSrc:  Oral    SpO2: 100%  100% 100%  Weight:      Height:       No intake or output data in the 24 hours ending 07/01/23 0935 Filed Weights   07/01/23 0548  Weight: 44.9 kg    Examination:  General exam: Appears calm and comfortable, malnourished Respiratory system: scattered rhonchi Cardiovascular system: S1 & S2 heard, RRR. No JVD, murmurs, rubs, gallops or clicks. No pedal edema. Gastrointestinal system: Abdomen is nondistended, soft and nontender. No organomegaly or masses felt. Normal bowel sounds heard. Central nervous system: Alert and oriented. No focal neurological deficits. Extremities: contractures, muscle wasting Skin: No visible rashes, lesions or ulcers Psychiatry: Judgement and  insight appear normal. Mood & affect appropriate.     Data Reviewed: I have personally reviewed following labs and imaging studies  CBC: Recent Labs  Lab 06/30/23 2232 07/01/23 0517  WBC 17.9* 13.2*  HGB 7.6* 7.5*  HCT 25.8* 25.7*  MCV 92.1 92.8  PLT 490* 426*   Basic Metabolic Panel: Recent Labs  Lab 06/30/23 2232 07/01/23 0517  NA 136  --   K 4.1  --    CL 97*  --   CO2 26  --   GLUCOSE 145*  --   BUN 17  --   CREATININE 1.20 1.08  CALCIUM 8.7*  --    GFR: Estimated Creatinine Clearance: 42.2 mL/min (by C-G formula based on SCr of 1.08 mg/dL). Liver Function Tests: No results for input(s): "AST", "ALT", "ALKPHOS", "BILITOT", "PROT", "ALBUMIN" in the last 168 hours. No results for input(s): "LIPASE", "AMYLASE" in the last 168 hours. No results for input(s): "AMMONIA" in the last 168 hours. Coagulation Profile: No results for input(s): "INR", "PROTIME" in the last 168 hours. Cardiac Enzymes: No results for input(s): "CKTOTAL", "CKMB", "CKMBINDEX", "TROPONINI" in the last 168 hours. BNP (last 3 results) No results for input(s): "PROBNP" in the last 8760 hours. HbA1C: No results for input(s): "HGBA1C" in the last 72 hours. CBG: No results for input(s): "GLUCAP" in the last 168 hours. Lipid Profile: No results for input(s): "CHOL", "HDL", "LDLCALC", "TRIG", "CHOLHDL", "LDLDIRECT" in the last 72 hours. Thyroid Function Tests: No results for input(s): "TSH", "T4TOTAL", "FREET4", "T3FREE", "THYROIDAB" in the last 72 hours. Anemia Panel: No results for input(s): "VITAMINB12", "FOLATE", "FERRITIN", "TIBC", "IRON", "RETICCTPCT" in the last 72 hours. Urine analysis:    Component Value Date/Time   COLORURINE YELLOW (A) 11/29/2020 2221   APPEARANCEUR CLEAR (A) 11/29/2020 2221   LABSPEC 1.036 (H) 11/29/2020 2221   PHURINE 5.0 11/29/2020 2221   GLUCOSEU NEGATIVE 11/29/2020 2221   HGBUR MODERATE (A) 11/29/2020 2221   BILIRUBINUR NEGATIVE 11/29/2020 2221   KETONESUR 5 (A) 11/29/2020 2221   PROTEINUR NEGATIVE 11/29/2020 2221   NITRITE NEGATIVE 11/29/2020 2221   LEUKOCYTESUR NEGATIVE 11/29/2020 2221   Sepsis Labs: @LABRCNTIP (procalcitonin:4,lacticidven:4)  ) Recent Results (from the past 240 hour(s))  Culture, blood (routine x 2)     Status: None (Preliminary result)   Collection Time: 07/01/23  1:50 AM   Specimen: BLOOD LEFT ARM   Result Value Ref Range Status   Specimen Description BLOOD LEFT ARM  Final   Special Requests   Final    BOTTLES DRAWN AEROBIC AND ANAEROBIC Blood Culture adequate volume   Culture   Final    NO GROWTH < 12 HOURS Performed at [facility], [address], [place], [place] [address]    Report Status PENDING  Incomplete  Culture, blood (routine x 2)     Status: None (Preliminary result)   Collection Time: 07/01/23  1:50 AM   Specimen: BLOOD RIGHT HAND  Result Value Ref Range Status   Specimen Description BLOOD RIGHT HAND  Final   Special Requests   Final    BOTTLES DRAWN AEROBIC AND ANAEROBIC Blood Culture adequate volume   Culture   Final    NO GROWTH < 12 HOURS Performed at [facility], [address] [place], [place] [address]    Report Status PENDING  Incomplete  Resp panel by RT-PCR (RSV, Flu A&B, Covid) Anterior Nasal Swab     Status: None   Collection Time: 07/01/23  5:17 AM   Specimen: Anterior Nasal  Swab  Result Value Ref Range Status   SARS Coronavirus 2 by RT PCR NEGATIVE NEGATIVE Final    Comment: (NOTE) SARS-CoV-2 target nucleic acids are NOT DETECTED.  The SARS-CoV-2 RNA is generally detectable in upper respiratory specimens during the acute phase of infection. The lowest concentration of SARS-CoV-2 viral copies this assay can detect is 138 copies/mL. A negative result does not preclude SARS-Cov-2 infection and should not be used as the sole basis for treatment or other patient management decisions. A negative result may occur with  improper specimen collection/handling, submission of specimen other than nasopharyngeal swab, presence of viral mutation(s) within the areas targeted by this assay, and inadequate number of viral copies(<138 copies/mL). A negative result must be combined with clinical observations, patient history, and epidemiological information. The expected result is Negative.  Fact Sheet for Patients:   BloggerCourse.com  Fact Sheet for Healthcare Providers:  SeriousBroker.it  This test is no t yet approved or cleared by the [place] FDA and  has been authorized for detection and/or diagnosis of SARS-CoV-2 by FDA under an Emergency Use Authorization (EUA). This EUA will remain  in effect (meaning this test can be used) for the duration of the COVID-19 declaration under Section 564(b)(1) of the Act, 21 U.S.C.section 360bbb-3(b)(1), unless the authorization is terminated  or revoked sooner.       Influenza A by PCR NEGATIVE NEGATIVE Final   Influenza B by PCR NEGATIVE NEGATIVE Final    Comment: (NOTE) The Xpert Xpress SARS-CoV-2/FLU/RSV plus assay is intended as an aid in the diagnosis of influenza from Nasopharyngeal swab specimens and should not be used as a sole basis for treatment. Nasal washings and aspirates are unacceptable for Xpert Xpress SARS-CoV-2/FLU/RSV testing.  Fact Sheet for Patients: BloggerCourse.com  Fact Sheet for Healthcare Providers: SeriousBroker.it  This test is not yet approved or cleared by the [place] FDA and has been authorized for detection and/or diagnosis of SARS-CoV-2 by FDA under an Emergency Use Authorization (EUA). This EUA will remain in effect (meaning this test can be used) for the duration of the COVID-19 declaration under Section 564(b)(1) of the Act, 21 U.S.C. section 360bbb-3(b)(1), unless the authorization is terminated or revoked.     Resp Syncytial Virus by PCR NEGATIVE NEGATIVE Final    Comment: (NOTE) Fact Sheet for Patients: BloggerCourse.com  Fact Sheet for Healthcare Providers: SeriousBroker.it  This test is not yet approved or cleared by the [place] FDA and has been authorized for detection and/or diagnosis of SARS-CoV-2 by FDA under an Emergency Use  Authorization (EUA). This EUA will remain in effect (meaning this test can be used) for the duration of the COVID-19 declaration under Section 564(b)(1) of the Act, 21 U.S.C. section 360bbb-3(b)(1), unless the authorization is terminated or revoked.  Performed at [facility], [address], [place], [place] [address]          Radiology Studies: DG Chest 2 View  Result Date: 06/30/2023 CLINICAL DATA:  Chest pain EXAM: CHEST - 2 VIEW COMPARISON:  Chest x-ray 11/29/2020.  CT of the chest 05/01/2020 FINDINGS: There is airspace disease in the inferior right upper lobe and minimally in the left lower lobe. There is no pleural effusion or pneumothorax. No acute fractures are seen. The cardiomediastinal silhouette is within normal limits. IMPRESSION: Airspace disease in the inferior right upper lobe and minimally in the left lower lobe, concerning for multifocal pneumonia. Follow-up chest x-ray recommended in 4-6 weeks to confirm resolution. Electronically Signed   By:  Darliss Cheney M.D.   On: 06/30/2023 23:27        Scheduled Meds:  enoxaparin (LOVENOX) injection  40 mg Subcutaneous Q24H   guaiFENesin  600 mg Oral BID   Continuous Infusions:  [START ON 07/02/2023] azithromycin     [START ON 07/02/2023] cefTRIAXone (ROCEPHIN)  IV       LOS: 0 days     Silvano Bilis, MD [facility]   If 7PM-7AM, please contact night-coverage [email or web address] Password [email or web address] 07/01/2023, 9:35 AM

## 2023-07-01 NOTE — ED Notes (Signed)
Lab called and per chelsea they can add on the new  resp panel test to swab in lab. Admit MD informed

## 2023-07-01 NOTE — Assessment & Plan Note (Signed)
Dietary consult PT eval

## 2023-07-01 NOTE — ED Provider Notes (Signed)
[facility] Provider Note    Event Date/Time   First MD Initiated Contact with Patient 07/01/23 0120     (approximate)   History   Chest Pain   HPI  Drew Holt is a 67 y.o. male brought to the ED via EMS from home with a chief complaint of chest pain x 1 hour.  Patient has a several day history of coughing.  Denies fever/chills, shortness of breath, abdominal pain, nausea, vomiting or dizziness.  Denies anticoagulant use.  Denies bloody stools.     Past Medical History   Past Medical History:  Diagnosis Date   Neuropathy      Active Problem List   Patient Active Problem List   Diagnosis Date Noted   Pneumonia 11/30/2020   Intractable vomiting with nausea 11/29/2020   Chest pain 05/01/2020   CAP (community acquired pneumonia) 05/01/2020   Hypokalemia 05/01/2020   GERD (gastroesophageal reflux disease) 05/01/2020   Nausea vomiting and diarrhea 05/01/2020   Acute metabolic encephalopathy 05/01/2020     Past Surgical History   Past Surgical History:  Procedure Laterality Date   HEMORRHOID SURGERY       Home Medications   Prior to Admission medications   Medication Sig Start Date End Date Taking? Authorizing Provider  cholecalciferol (VITAMIN D3) 25 MCG (1000 UNIT) tablet Take 1,000 Units by mouth daily.    [provider]  diphenhydrAMINE (BENADRYL) 25 MG tablet Take 25-50 mg by mouth daily as needed for allergies or itching.    [provider]  famotidine (PEPCID) 20 MG tablet Take 20 mg by mouth 2 (two) times daily as needed for heartburn or indigestion.    [provider]  HYDROcodone-acetaminophen (NORCO) 10-325 MG tablet Take 1 tablet by mouth every 4 (four) hours as needed for moderate pain or severe pain.    [provider]  ondansetron (ZOFRAN-ODT) 4 MG disintegrating tablet Take 1 tablet (4 mg total) by mouth every 8 (eight) hours as needed for nausea or vomiting. 12/01/20   Danford,  Earl Lites, MD  polyethylene glycol (MIRALAX / GLYCOLAX) 17 g packet Take 17 g by mouth daily. 12/01/20   Danford, Earl Lites, MD  sennosides-docusate sodium (SENOKOT-S) 8.6-50 MG tablet Take 2 tablets by mouth daily as needed for constipation.    [provider]  therapeutic multivitamin-minerals (THERAGRAN-M) tablet Take 1 tablet by mouth daily.    [provider]     Allergies  Patient has no known allergies.   Family History   Family History  Problem Relation Age of Onset   Diabetes Mellitus II Sister    Liver disease Sister      Physical Exam  Triage Vital Signs: ED Triage Vitals  Encounter Vitals Group     BP 06/30/23 2230 125/74     Systolic BP Percentile --      Diastolic BP Percentile --      Pulse Rate 06/30/23 2230 (!) 123     Resp 06/30/23 2230 20     Temp 06/30/23 2230 97.9 F (36.6 C)     Temp src --      SpO2 06/30/23 2230 97 %     Weight --      Height 06/30/23 2230 5\' 4"  (1.626 m)     Head Circumference --      Peak Flow --      Pain Score 06/30/23 2230 9     Pain Loc --      Pain  Education --      Exclude from [employer] Chart --     Updated Vital Signs: BP 131/78   Pulse 96   Temp 98.1 F (36.7 C) (Oral)   Resp (!) 22   Ht 5\' 4"  (1.626 m)   SpO2 100%   BMI 19.22 kg/m    General: Awake, no distress.  CV:  RRR.  Good peripheral perfusion.  Resp:  Normal effort.  Scattered rhonchi.  Active dry cough. Abd:  Nontender.  No distention.  Other:  Pale, cachectic.   ED Results / Procedures / Treatments  Labs (all labs ordered are listed, but only abnormal results are displayed) Labs Reviewed  BASIC METABOLIC PANEL - Abnormal; Notable for the following components:      Result Value   Chloride 97 (*)    Glucose, Bld 145 (*)    Calcium 8.7 (*)    All other components within normal limits  CBC - Abnormal; Notable for the following components:   WBC 17.9 (*)    RBC 2.80 (*)    Hemoglobin 7.6 (*)    HCT 25.8 (*)     MCHC 29.5 (*)    Platelets 490 (*)    All other components within normal limits  CULTURE, BLOOD (ROUTINE X 2)  CULTURE, BLOOD (ROUTINE X 2)  LACTIC ACID, PLASMA  TROPONIN I (HIGH SENSITIVITY)  TROPONIN I (HIGH SENSITIVITY)     EKG  ED ECG REPORT I, Nnamdi Dacus J, the attending physician, personally viewed and interpreted this ECG.   Date: 07/01/2023  EKG Time: 2232  Rate: 118  Rhythm: sinus tachycardia  Axis: Normal  Intervals:none  ST&T Change: Nonspecific    RADIOLOGY I have independently visualized and interpreted patient's x-ray as well as noted the radiology interpretation:  Chest x-ray: Multifocal pneumonia  Official radiology report(s): DG Chest 2 View  Result Date: 06/30/2023 CLINICAL DATA:  Chest pain EXAM: CHEST - 2 VIEW COMPARISON:  Chest x-ray 11/29/2020.  CT of the chest 05/01/2020 FINDINGS: There is airspace disease in the inferior right upper lobe and minimally in the left lower lobe. There is no pleural effusion or pneumothorax. No acute fractures are seen. The cardiomediastinal silhouette is within normal limits. IMPRESSION: Airspace disease in the inferior right upper lobe and minimally in the left lower lobe, concerning for multifocal pneumonia. Follow-up chest x-ray recommended in 4-6 weeks to confirm resolution. Electronically Signed   By: Darliss Cheney M.D.   On: 06/30/2023 23:27     PROCEDURES:  Critical Care performed: No  .1-3 Lead EKG Interpretation  Performed by: Irean Hong, MD Authorized by: Irean Hong, MD     Interpretation: normal     ECG rate:  95   ECG rate assessment: normal     Rhythm: sinus rhythm     Ectopy: none     Conduction: normal   Comments:     Patient placed on cardiac monitor to evaluate for arrhythmias    MEDICATIONS ORDERED IN ED: Medications  cefTRIAXone (ROCEPHIN) 1 g in sodium chloride 0.9 % 100 mL IVPB (1 g Intravenous New Bag/Given 07/01/23 0208)  azithromycin (ZITHROMAX) 500 mg in sodium chloride 0.9 %  250 mL IVPB (500 mg Intravenous New Bag/Given 07/01/23 0209)  ipratropium-albuterol (DUONEB) 0.5-2.5 (3) MG/3ML nebulizer solution 3 mL (3 mLs Nebulization Given 07/01/23 0213)     IMPRESSION / MDM / ASSESSMENT AND PLAN / ED COURSE  I reviewed the triage vital signs and the nursing notes.  67y/o male presenting with generalized weakness, cough and shortness of breath. Differential includes, but is not limited to, viral syndrome, bronchitis including COPD exacerbation, pneumonia, reactive airway disease including asthma, CHF including exacerbation with or without pulmonary/interstitial edema, pneumothorax, ACS, thoracic trauma, and pulmonary embolism.  I personally reviewed patient's records and note a hospitalization from 11/2020 for sepsis.  Patient's presentation is most consistent with acute presentation with potential threat to life or bodily function.  The patient is on the cardiac monitor to evaluate for evidence of arrhythmia and/or significant heart rate changes.  Laboratory results demonstrate leukocytosis with WBC 17.9, hemoglobin 7.6.  Unremarkable electrolytes and troponin.  Lactic acid is negative.  Will administer DuoNeb, start IV antibiotics, perform ambulation trial.  Clinical Course as of 07/01/23 0351  Sun Jul 01, 2023  4540 Patient could not stand up to ambulate. States he has been crawling around his house to get around. Will consult hospitalist services for evaluation and admission. [JS]    Clinical Course User Index [JS] Irean Hong, MD     FINAL CLINICAL IMPRESSION(S) / ED DIAGNOSES   Final diagnoses:  Nonspecific chest pain  Community acquired pneumonia, unspecified laterality  Anemia, unspecified type  Generalized weakness     Rx / DC Orders   ED Discharge Orders     None        Note:  This document was prepared using Dragon voice recognition software and may include unintentional dictation errors.   Irean Hong,  MD 07/01/23 [phone]

## 2023-07-01 NOTE — H&P (Signed)
History and Physical    Patient: Drew Holt ZOX:[id number] DOB: July 07, 1956 DOA: 07/01/2023 DOS: the patient was seen and examined on 07/01/2023 PCP: Etheleen Nicks, NP  Patient coming from: Home  Chief Complaint:  Chief Complaint  Patient presents with   Chest Pain    HPI: Drew Holt is a 67 y.o. 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  Denies fever chills, shortness of breath.  States the rib cage pain is the most bothersome thing for him.  He has had no nausea or vomiting, belly pain, diarrhea ED course and data review: Tachycardic to 123 with respirations 20-22 and O2 sat high 90s on room air.  Afebrile and normotensive Labs: Significant for WBC 18,000 and lactic acid 1.1 Hemoglobin 7.6 down from 11.4 in February 2024 Troponin 7 EKG, personally viewed and interpreted showing sinus tachycardia at 118 with no ischemic ST-T wave changes Chest x-ray consistent with multifocal pneumonia as detailed as follows: IMPRESSION: Airspace disease in the inferior right upper lobe and minimally in the left lower lobe, concerning for multifocal pneumonia. Follow-up chest x-ray recommended in 4-6 weeks to confirm resolution.  Patient started on Rocephin and azithromycin and given a [facility] Hospitalist consulted for admission.   Review of Systems: As mentioned in the history of present illness. All other systems reviewed and are negative.  Past Medical History:  Diagnosis Date   Neuropathy    Past Surgical History:  Procedure Laterality Date   HEMORRHOID SURGERY     Social History:  reports that he has never smoked. He has never used smokeless  tobacco. He reports that he does not drink alcohol and does not use drugs.  No Known Allergies  Family History  Problem Relation Age of Onset   Diabetes Mellitus II Sister    Liver disease Sister     Prior to Admission medications   Medication Sig Start Date End Date Taking? Authorizing Provider  cholecalciferol (VITAMIN D3) 25 MCG (1000 UNIT) tablet Take 1,000 Units by mouth daily.    [provider]  diphenhydrAMINE (BENADRYL) 25 MG tablet Take 25-50 mg by mouth daily as needed for allergies or itching.    [provider]  famotidine (PEPCID) 20 MG tablet Take 20 mg by mouth 2 (two) times daily as needed for heartburn or indigestion.    [provider]  HYDROcodone-acetaminophen (NORCO) 10-325 MG tablet Take 1 tablet by mouth every 4 (four) hours as needed for moderate pain or severe pain.    [provider]  ondansetron (ZOFRAN-ODT) 4 MG disintegrating tablet Take 1 tablet (4 mg total) by mouth every 8 (eight) hours as needed for nausea or vomiting. 12/01/20   Danford, Earl Lites, MD  polyethylene glycol (MIRALAX / GLYCOLAX) 17 g packet Take 17 g by mouth daily. 12/01/20   Danford, Earl Lites, MD  sennosides-docusate sodium (SENOKOT-S) 8.6-50 MG tablet Take 2 tablets by mouth daily as needed for constipation.    [provider]  therapeutic multivitamin-minerals (THERAGRAN-M) tablet Take 1 tablet by mouth daily.    [provider]    Physical Exam: Vitals:   06/30/23 2230 07/01/23 0130 07/01/23 0200 07/01/23 0213  BP: 125/74 124/86 131/78   Pulse: Marland Kitchen)  123 96    Resp: 20 20 (!) 22   Temp: 97.9 F (36.6 C)   98.1 F (36.7 C)  TempSrc:    Oral  SpO2: 97% 100%    Height:       Physical Exam Vitals and nursing note reviewed.  Constitutional:      General: He is not in acute distress.    Appearance: He is cachectic.     Comments: Very frail-appearing, severe temporal wasting with prominence of bony eminences.  Patient  appears uncomfortable, holding onto lateral right chest due to pain.  Coughing up scant amount of thick yellow tenacious phlegm into container  HENT:     Head: Normocephalic and atraumatic.  Cardiovascular:     Rate and Rhythm: Regular rhythm. Tachycardia present.     Heart sounds: Normal heart sounds.  Pulmonary:     Effort: Pulmonary effort is normal.     Breath sounds: Normal breath sounds. Decreased air movement present.  Abdominal:     Palpations: Abdomen is soft.     Tenderness: There is no abdominal tenderness.  Neurological:     Mental Status: Mental status is at baseline.     Labs on Admission: I have personally reviewed following labs and imaging studies  CBC: Recent Labs  Lab 06/30/23 2232  WBC 17.9*  HGB 7.6*  HCT 25.8*  MCV 92.1  PLT 490*   Basic Metabolic Panel: Recent Labs  Lab 06/30/23 2232  NA 136  K 4.1  CL 97*  CO2 26  GLUCOSE 145*  BUN 17  CREATININE 1.20  CALCIUM 8.7*   GFR: CrCl cannot be calculated (Unknown ideal weight.). Liver Function Tests: No results for input(s): "AST", "ALT", "ALKPHOS", "BILITOT", "PROT", "ALBUMIN" in the last 168 hours. No results for input(s): "LIPASE", "AMYLASE" in the last 168 hours. No results for input(s): "AMMONIA" in the last 168 hours. Coagulation Profile: No results for input(s): "INR", "PROTIME" in the last 168 hours. Cardiac Enzymes: No results for input(s): "CKTOTAL", "CKMB", "CKMBINDEX", "TROPONINI" in the last 168 hours. BNP (last 3 results) No results for input(s): "PROBNP" in the last 8760 hours. HbA1C: No results for input(s): "HGBA1C" in the last 72 hours. CBG: No results for input(s): "GLUCAP" in the last 168 hours. Lipid Profile: No results for input(s): "CHOL", "HDL", "LDLCALC", "TRIG", "CHOLHDL", "LDLDIRECT" in the last 72 hours. Thyroid Function Tests: No results for input(s): "TSH", "T4TOTAL", "FREET4", "T3FREE", "THYROIDAB" in the last 72 hours. Anemia Panel: No results for  input(s): "VITAMINB12", "FOLATE", "FERRITIN", "TIBC", "IRON", "RETICCTPCT" in the last 72 hours. Urine analysis:    Component Value Date/Time   COLORURINE YELLOW (A) 11/29/2020 2221   APPEARANCEUR CLEAR (A) 11/29/2020 2221   LABSPEC 1.036 (H) 11/29/2020 2221   PHURINE 5.0 11/29/2020 2221   GLUCOSEU NEGATIVE 11/29/2020 2221   HGBUR MODERATE (A) 11/29/2020 2221   BILIRUBINUR NEGATIVE 11/29/2020 2221   KETONESUR 5 (A) 11/29/2020 2221   PROTEINUR NEGATIVE 11/29/2020 2221   NITRITE NEGATIVE 11/29/2020 2221   LEUKOCYTESUR NEGATIVE 11/29/2020 2221    Radiological Exams on Admission: DG Chest 2 View  Result Date: 06/30/2023 CLINICAL DATA:  Chest pain EXAM: CHEST - 2 VIEW COMPARISON:  Chest x-ray 11/29/2020.  CT of the chest 05/01/2020 FINDINGS: There is airspace disease in the inferior right upper lobe and minimally in the left lower lobe. There is no pleural effusion or pneumothorax. No acute fractures are seen. The cardiomediastinal silhouette is within normal limits. IMPRESSION: Airspace disease in the inferior right upper lobe  and minimally in the left lower lobe, concerning for multifocal pneumonia. Follow-up chest x-ray recommended in 4-6 weeks to confirm resolution. Electronically Signed   By: Darliss Cheney M.D.   On: 06/30/2023 23:27     Data Reviewed: Relevant notes from primary care and specialist visits, past discharge summaries as available in EHR, including Care Everywhere. Prior diagnostic testing as pertinent to current admission diagnoses Updated medications and problem lists for reconciliation ED course, including vitals, labs, imaging, treatment and response to treatment Triage notes, nursing and pharmacy notes and ED provider's notes Notable results as noted in HPI   Assessment and Plan: * Multifocal pneumonia SIRS/possible sepsis SIRS criteria include tachycardia and tachypnea, leukocytosis Rocephin and azithromycin Will get nasal viral swab, sputum  culture Antitussives, DuoNebs as needed Incentive spirometer As needed O2 Speech therapy eval, patient likely increased aspiration risk  Chest pain Right-sided chest pain (lateral rib cage) likely related to pneumonia Pain control Can consider additional imaging if persistent   Diffuse sensory axonal peripheral polyneuropathy Abnormality of gait due to impairment of balance On opiates for pain Fall precautions Consider PT eval  Protein-calorie malnutrition, severe (HCC) Dietary consult PT eval    DVT prophylaxis: Lovenox  Consults: none  Advance Care Planning:   Code Status: Prior   Family Communication: none  Disposition Plan: Back to previous home environment  Severity of Illness: The appropriate patient status for this patient is INPATIENT. Inpatient status is judged to be reasonable and necessary in order to provide the required intensity of service to ensure the patient's safety. The patient's presenting symptoms, physical exam findings, and initial radiographic and laboratory data in the context of their chronic comorbidities is felt to place them at high risk for further clinical deterioration. Furthermore, it is not anticipated that the patient will be medically stable for discharge from the hospital within 2 midnights of admission.   * I certify that at the point of admission it is my clinical judgment that the patient will require inpatient hospital care spanning beyond 2 midnights from the point of admission due to high intensity of service, high risk for further deterioration and high frequency of surveillance required.*  Author: Andris Baumann, MD 07/01/2023 4:33 AM  For on call review [email or web address].

## 2023-07-02 DIAGNOSIS — E538 Deficiency of other specified B group vitamins: Secondary | ICD-10-CM | POA: Insufficient documentation

## 2023-07-02 DIAGNOSIS — J189 Pneumonia, unspecified organism: Secondary | ICD-10-CM | POA: Diagnosis not present

## 2023-07-02 LAB — BASIC METABOLIC PANEL
Anion gap: 10 (ref 5–15)
BUN: 14 mg/dL (ref 8–23)
CO2: 26 mmol/L (ref 22–32)
Calcium: 8.7 mg/dL — ABNORMAL LOW (ref 8.9–10.3)
Chloride: 100 mmol/L (ref 98–111)
Creatinine, Ser: 0.93 mg/dL (ref 0.61–1.24)
GFR, Estimated: >60 mL/min (ref >60)
Glucose, Bld: 103 mg/dL — ABNORMAL HIGH (ref 70–99)
Potassium: 4.7 mmol/L (ref 3.5–5.1)
Sodium: 136 mmol/L (ref 135–145)

## 2023-07-02 LAB — CBC
HCT: 24.8 % — ABNORMAL LOW (ref 39.0–52.0)
Hemoglobin: 7.8 g/dL — ABNORMAL LOW (ref 13.0–17.0)
MCH: 27.8 pg (ref 26.0–34.0)
MCHC: 31.5 g/dL (ref 30.0–36.0)
MCV: 88.3 fL (ref 80.0–100.0)
Platelets: 433 10*3/uL — ABNORMAL HIGH (ref 150–400)
RBC: 2.81 MIL/uL — ABNORMAL LOW (ref 4.22–5.81)
RDW: 14.5 % (ref 11.5–15.5)
WBC: 17.2 10*3/uL — ABNORMAL HIGH (ref 4.0–10.5)
nRBC: 0 % (ref 0.0–0.2)

## 2023-07-02 LAB — LEGIONELLA PNEUMOPHILA SEROGP 1 UR AG: L. pneumophila Serogp 1 Ur Ag: NEGATIVE

## 2023-07-02 MED ORDER — AZITHROMYCIN 250 MG PO TABS: ORAL_TABLET | ORAL | 0 refills | Status: DC

## 2023-07-02 MED ORDER — FERROUS SULFATE 325 (65 FE) MG PO TBEC
325.0000 mg | DELAYED_RELEASE_TABLET | ORAL | 3 refills | Status: AC
Start: 2023-07-02 — End: 2024-07-01

## 2023-07-02 MED ORDER — PANTOPRAZOLE SODIUM 40 MG PO TBEC: 40.0000 mg | DELAYED_RELEASE_TABLET | Freq: Every day | ORAL | 1 refills | Status: AC

## 2023-07-02 MED ORDER — VITAMIN B-12 1000 MCG PO TABS: 1000.0000 ug | ORAL_TABLET | Freq: Every day | ORAL | 1 refills | Status: DC

## 2023-07-02 MED ORDER — AMOXICILLIN-POT CLAVULANATE 875-125 MG PO TABS: 1.0000 | ORAL_TABLET | Freq: Two times a day (BID) | ORAL | 0 refills | Status: DC

## 2023-07-02 NOTE — Plan of Care (Signed)
  Problem: Activity: Goal: Ability to tolerate increased activity will improve Outcome: Progressing   Problem: Clinical Measurements: Goal: Ability to maintain a body temperature in the normal range will improve Outcome: Progressing   Problem: Respiratory: Goal: Ability to maintain adequate ventilation will improve Outcome: Progressing Goal: Ability to maintain a clear airway will improve Outcome: Progressing   Problem: Education: Goal: Knowledge of General Education information will improve Description: Including pain rating scale, medication(s)/side effects and non-pharmacologic comfort measures Outcome: Progressing   Problem: Health Behavior/Discharge Planning: Goal: Ability to manage health-related needs will improve Outcome: Progressing   Problem: Clinical Measurements: Goal: Ability to maintain clinical measurements within normal limits will improve Outcome: Progressing Goal: Will remain free from infection Outcome: Progressing Goal: Diagnostic test results will improve Outcome: Progressing Goal: Respiratory complications will improve Outcome: Progressing Goal: Cardiovascular complication will be avoided Outcome: Progressing   Problem: Nutrition: Goal: Adequate nutrition will be maintained Outcome: Progressing   Problem: Activity: Goal: Risk for activity intolerance will decrease Outcome: Progressing   Problem: Coping: Goal: Level of anxiety will decrease Outcome: Progressing   Problem: Elimination: Goal: Will not experience complications related to bowel motility Outcome: Progressing Goal: Will not experience complications related to urinary retention Outcome: Progressing   Problem: Pain Managment: Goal: General experience of comfort will improve Outcome: Progressing   Problem: Safety: Goal: Ability to remain free from injury will improve Outcome: Progressing   Problem: Skin Integrity: Goal: Risk for impaired skin integrity will decrease Outcome:  Progressing

## 2023-07-02 NOTE — Care Management Obs Status (Signed)
MEDICARE OBSERVATION STATUS NOTIFICATION   Patient Details  Name: Drew Holt MRN: [id number] Date of Birth: [birth year]   Medicare Observation Status Notification Given:  Yes    Erdine Hulen, LCSW 07/02/2023, 10:59 AM

## 2023-07-02 NOTE — Discharge Summary (Signed)
Drew Holt WUJ:[id number] DOB: 03-23-1956 DOA: 07/01/2023  PCP: Etheleen Nicks, NP  Admit date: 07/01/2023 Discharge date: 07/02/2023  Time spent: 35 minutes  Recommendations for Outpatient Follow-up:  Pcp f/u GI f/u Cbc at f/u     Discharge Diagnoses:  Principal Problem:   Multifocal pneumonia Active Problems:   Chest pain   Diffuse sensory axonal peripheral polyneuropathy   Abnormality of gait due to impairment of balance   Protein-calorie malnutrition, severe (HCC)   Discharge Condition: stable  Diet recommendation: heart healthy  Filed Weights   07/01/23 0548  Weight: 44.9 kg    History of present illness:  From admission h and p Drew Holt is a 67 y.o. 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  Denies fever chills, shortness of breath.  States the rib cage pain is the most bothersome thing for him.  He has had no nausea or vomiting, belly pain, diarrhea   Hospital Course:  Patient presents with cough. CXR with multifocal pneumonia. No hypoxia and hemodynamically stable. STarted on ceftriaxone/azithromycin. Covid and respiratory viral panel negative. Also endorsed chest pain, w/u was negative. Patient is severely malnourished and has been progressively declining for the past 8 years from his idiopathic sensory peripheral neuropathy. Previously followed by [facility] but hasn't seen them since 2018. Neurology consulted who advised appropriate prior w/u performed, no further w/u indicated. Given severe malnutrion ct of chest, abdomen, and pelvis was performed. No PE and no acute finding other than the multifocal pneumonia seen on CXR.  Pneumonia symptoms are mild and patient will be discharge with azithromycin and augmentin. PT evaluated and advised home health which we will continue. Labs notable for anemia with hgb stable in the 7s, w/u notable for iron and b12 deficiency. Patient does take daily nsaid (bc powders) but denies melena/hematochezia or other bleeding. Case discussed with GI who deferred additional inpatient w/u given co-occurring pneumonia and stability. Will start b12 and iron supplement as well as PPI, advise ceasing all nsaids, and have referred to GI for endoscopy consideration. Also advising PCP f/u, would check a CBC then. No answer when either daughter called - message left with these instructions.  Procedures: none   Consultations: GI  Discharge Exam: Vitals:   07/02/23 0400 07/02/23 0808  BP: 128/84 108/76  Pulse: 94 (!) 104  Resp: 20 18  Temp: 98.4 F (36.9 C) (!) 97.5 F (36.4 C)  SpO2: 98% 99%    General exam: Appears calm and comfortable, malnourished Respiratory system: scattered rhonchi Cardiovascular system: S1 & S2 heard, RRR. No JVD, murmurs, rubs, gallops or clicks. No pedal edema. Gastrointestinal system: Abdomen is nondistended, soft and nontender. No organomegaly or masses felt. Normal bowel sounds heard. Central nervous system: Alert and oriented. No focal neurological deficits. Extremities: contractures, muscle wasting Skin: No visible rashes, lesions or ulcers Psychiatry: Judgement and insight appear normal. Mood & affect appropriate.   Discharge Instructions   Discharge Instructions     Ambulatory referral to Gastroenterology   Complete by: As directed    What is the reason for referral?: Other   Diet - low sodium heart healthy   Complete by: As directed    Increase activity slowly  Complete by: As directed       Allergies as of 07/02/2023   No Known Allergies      Medication List     STOP taking these medications    famotidine 20 MG tablet Commonly  known as: PEPCID       TAKE these medications    amoxicillin-clavulanate 875-125 MG tablet Commonly known as: AUGMENTIN Take 1 tablet by mouth 2 (two) times daily. Starting on 10/15   azithromycin 250 MG tablet Commonly known as: Zithromax Take two tabs together on 10/15   cholecalciferol 25 MCG (1000 UNIT) tablet Commonly known as: VITAMIN D3 Take 1,000 Units by mouth daily.   diphenhydrAMINE 25 MG tablet Commonly known as: BENADRYL Take 25-50 mg by mouth daily as needed for allergies or itching.   ferrous sulfate 325 (65 FE) MG EC tablet Take 1 tablet (325 mg total) by mouth every other day.   HYDROcodone-acetaminophen 10-325 MG tablet Commonly known as: NORCO Take 1 tablet by mouth every 4 (four) hours as needed for moderate pain or severe pain.   naloxone 4 MG/0.1ML Liqd nasal spray kit Commonly known as: NARCAN Place 1 spray into the nose once.   ondansetron 4 MG disintegrating tablet Commonly known as: ZOFRAN-ODT Take 1 tablet (4 mg total) by mouth every 8 (eight) hours as needed for nausea or vomiting.   pantoprazole 40 MG tablet Commonly known as: Protonix Take 1 tablet (40 mg total) by mouth daily.   polyethylene glycol 17 g packet Commonly known as: MIRALAX / GLYCOLAX Take 17 g by mouth daily.   sennosides-docusate sodium 8.6-50 MG tablet Commonly known as: SENOKOT-S Take 2 tablets by mouth daily as needed for constipation.   therapeutic multivitamin-minerals tablet Take 1 tablet by mouth daily.       No Known Allergies  Follow-up Information     Etheleen Nicks, NP Follow up.   Contact information: [address] [place] [address] [phone]                  The results of significant diagnostics from this hospitalization (including imaging, microbiology, ancillary and laboratory) are listed below for reference.    Significant Diagnostic Studies: CT Angio Chest Pulmonary Embolism (PE) W or WO Contrast  Result Date:  07/01/2023 CLINICAL DATA:  Positive D-dimer, dyspnea, cough, anemia, failure to thrive EXAM: CT ANGIOGRAPHY CHEST CT ABDOMEN AND PELVIS WITH CONTRAST TECHNIQUE: Multidetector CT imaging of the chest was performed using the standard protocol during bolus administration of intravenous contrast. Multiplanar CT image reconstructions and MIPs were obtained to evaluate the vascular anatomy. Multidetector CT imaging of the abdomen and pelvis was performed using the standard protocol during bolus administration of intravenous contrast. RADIATION DOSE REDUCTION: This exam was performed according to the departmental dose-optimization program which includes automated exposure control, adjustment of the mA and/or kV according to patient size and/or use of iterative reconstruction technique. CONTRAST:  75mL OMNIPAQUE IOHEXOL 350 MG/ML SOLN COMPARISON:  11/29/2020 and previous FINDINGS: CTA CHEST FINDINGS Cardiovascular: SVC patent. Heart size normal. No pericardial effusion. The RV is nondilated. Satisfactory opacification of pulmonary arteries noted, and there is no evidence of pulmonary emboli. Incomplete opacification of left lower lobe pulmonary artery branches. Scattered left coronary calcifications. Adequate contrast opacification of the thoracic aorta with no evidence of dissection, aneurysm, or stenosis. There is classic 3-vessel brachiocephalic arch anatomy without proximal stenosis. Mediastinum/Nodes: No mass or adenopathy. Lungs/Pleura: Trace bilateral pleural effusions, left greater than right. Scattered small airspace opacities throughout the  left upper lobe and superior segment left lower lobe. There is more dense airspace consolidation throughout the anterior segment right upper lobe. There is significant atelectasis and volume loss involving the right middle lobe, central airways appearing grossly patent. On the left, dense airspace consolidation throughout the posterior segment left lower lobe, with patchy  opacities in the medial and lateral basal segments. Musculoskeletal: No chest wall abnormality. No acute or significant osseous findings. Review of the MIP images confirms the above findings. CT ABDOMEN and PELVIS FINDINGS Hepatobiliary: No focal liver abnormality is seen. No gallstones, gallbladder wall thickening, or biliary dilatation. Pancreas: Unremarkable. No pancreatic ductal dilatation or surrounding inflammatory changes. Spleen: Normal in size without focal abnormality. Adrenals/Urinary Tract: Adrenal glands are unremarkable. Kidneys are normal, without renal calculi, focal lesion, or hydronephrosis. Bladder is unremarkable. Stomach/Bowel: Stomach small bowel decompressed. Appendix not discretely identified. Colon is partially distended, without acute finding. Vascular/Lymphatic: Scattered calcified aortoiliac atheromatous plaque without AAA. Portal vein patent. No abdominal or pelvic adenopathy. Reproductive: Mild prostate enlargement with central coarse calcifications. Other: No ascites.  No free air. Musculoskeletal: No acute or significant osseous findings. Review of the MIP images confirms the above findings. IMPRESSION: 1. Negative for acute PE or thoracic aortic dissection. 2. Multifocal bilateral airspace opacities, with dense consolidation in the right upper and left lower lobes, favor infectious etiology. Recommend follow-up to confirm appropriate resolution. 3. Trace bilateral pleural effusions. 4. No acute findings in the abdomen or pelvis. 5. Coronary and aortic Atherosclerosis (ICD10-I70.0). Electronically Signed   By: Corlis Leak M.D.   On: 07/01/2023 14:50   CT ABDOMEN PELVIS W CONTRAST  Result Date: 07/01/2023 CLINICAL DATA:  Positive D-dimer, dyspnea, cough, anemia, failure to thrive EXAM: CT ANGIOGRAPHY CHEST CT ABDOMEN AND PELVIS WITH CONTRAST TECHNIQUE: Multidetector CT imaging of the chest was performed using the standard protocol during bolus administration of intravenous  contrast. Multiplanar CT image reconstructions and MIPs were obtained to evaluate the vascular anatomy. Multidetector CT imaging of the abdomen and pelvis was performed using the standard protocol during bolus administration of intravenous contrast. RADIATION DOSE REDUCTION: This exam was performed according to the departmental dose-optimization program which includes automated exposure control, adjustment of the mA and/or kV according to patient size and/or use of iterative reconstruction technique. CONTRAST:  75mL OMNIPAQUE IOHEXOL 350 MG/ML SOLN COMPARISON:  11/29/2020 and previous FINDINGS: CTA CHEST FINDINGS Cardiovascular: SVC patent. Heart size normal. No pericardial effusion. The RV is nondilated. Satisfactory opacification of pulmonary arteries noted, and there is no evidence of pulmonary emboli. Incomplete opacification of left lower lobe pulmonary artery branches. Scattered left coronary calcifications. Adequate contrast opacification of the thoracic aorta with no evidence of dissection, aneurysm, or stenosis. There is classic 3-vessel brachiocephalic arch anatomy without proximal stenosis. Mediastinum/Nodes: No mass or adenopathy. Lungs/Pleura: Trace bilateral pleural effusions, left greater than right. Scattered small airspace opacities throughout the left upper lobe and superior segment left lower lobe. There is more dense airspace consolidation throughout the anterior segment right upper lobe. There is significant atelectasis and volume loss involving the right middle lobe, central airways appearing grossly patent. On the left, dense airspace consolidation throughout the posterior segment left lower lobe, with patchy opacities in the medial and lateral basal segments. Musculoskeletal: No chest wall abnormality. No acute or significant osseous findings. Review of the MIP images confirms the above findings. CT ABDOMEN and PELVIS FINDINGS Hepatobiliary: No focal liver abnormality is seen. No gallstones,  gallbladder wall thickening, or biliary dilatation. Pancreas: Unremarkable. No pancreatic ductal  dilatation or surrounding inflammatory changes. Spleen: Normal in size without focal abnormality. Adrenals/Urinary Tract: Adrenal glands are unremarkable. Kidneys are normal, without renal calculi, focal lesion, or hydronephrosis. Bladder is unremarkable. Stomach/Bowel: Stomach small bowel decompressed. Appendix not discretely identified. Colon is partially distended, without acute finding. Vascular/Lymphatic: Scattered calcified aortoiliac atheromatous plaque without AAA. Portal vein patent. No abdominal or pelvic adenopathy. Reproductive: Mild prostate enlargement with central coarse calcifications. Other: No ascites.  No free air. Musculoskeletal: No acute or significant osseous findings. Review of the MIP images confirms the above findings. IMPRESSION: 1. Negative for acute PE or thoracic aortic dissection. 2. Multifocal bilateral airspace opacities, with dense consolidation in the right upper and left lower lobes, favor infectious etiology. Recommend follow-up to confirm appropriate resolution. 3. Trace bilateral pleural effusions. 4. No acute findings in the abdomen or pelvis. 5. Coronary and aortic Atherosclerosis (ICD10-I70.0). Electronically Signed   By: Corlis Leak M.D.   On: 07/01/2023 14:50   DG Chest 2 View  Result Date: 06/30/2023 CLINICAL DATA:  Chest pain EXAM: CHEST - 2 VIEW COMPARISON:  Chest x-ray 11/29/2020.  CT of the chest 05/01/2020 FINDINGS: There is airspace disease in the inferior right upper lobe and minimally in the left lower lobe. There is no pleural effusion or pneumothorax. No acute fractures are seen. The cardiomediastinal silhouette is within normal limits. IMPRESSION: Airspace disease in the inferior right upper lobe and minimally in the left lower lobe, concerning for multifocal pneumonia. Follow-up chest x-ray recommended in 4-6 weeks to confirm resolution. Electronically Signed    By: Darliss Cheney M.D.   On: 06/30/2023 23:27    Microbiology: Recent Results (from the past 240 hour(s))  Culture, blood (routine x 2)     Status: None (Preliminary result)   Collection Time: 07/01/23  1:50 AM   Specimen: BLOOD LEFT ARM  Result Value Ref Range Status   Specimen Description BLOOD LEFT ARM  Final   Special Requests   Final    BOTTLES DRAWN AEROBIC AND ANAEROBIC Blood Culture adequate volume   Culture   Final    NO GROWTH 1 DAY Performed at [facility], [address] [place], [place] [address]    Report Status PENDING  Incomplete  Culture, blood (routine x 2)     Status: None (Preliminary result)   Collection Time: 07/01/23  1:50 AM   Specimen: BLOOD RIGHT HAND  Result Value Ref Range Status   Specimen Description BLOOD RIGHT HAND  Final   Special Requests   Final    BOTTLES DRAWN AEROBIC AND ANAEROBIC Blood Culture adequate volume   Culture   Final    NO GROWTH 1 DAY Performed at [facility], [address], [place], [place] [address]    Report Status PENDING  Incomplete  Resp panel by RT-PCR (RSV, Flu A&B, Covid) Anterior Nasal Swab     Status: None   Collection Time: 07/01/23  5:17 AM   Specimen: Anterior Nasal Swab  Result Value Ref Range Status   SARS Coronavirus 2 by RT PCR NEGATIVE NEGATIVE Final    Comment: (NOTE) SARS-CoV-2 target nucleic acids are NOT DETECTED.  The SARS-CoV-2 RNA is generally detectable in upper respiratory specimens during the acute phase of infection. The lowest concentration of SARS-CoV-2 viral copies this assay can detect is 138 copies/mL. A negative result does not preclude SARS-Cov-2 infection and should not be used as the sole basis for treatment or other patient management decisions. A negative result may occur with  improper specimen collection/handling, submission of specimen other than nasopharyngeal swab, presence of viral mutation(s) within the areas targeted by this assay, and inadequate  number of viral copies(<138 copies/mL). A negative result must be combined with clinical observations, patient history, and epidemiological information. The expected result is Negative.  Fact Sheet for Patients:  BloggerCourse.com  Fact Sheet for Healthcare Providers:  SeriousBroker.it  This test is no t yet approved or cleared by the [place] FDA and  has been authorized for detection and/or diagnosis of SARS-CoV-2 by FDA under an Emergency Use Authorization (EUA). This EUA will remain  in effect (meaning this test can be used) for the duration of the COVID-19 declaration under Section 564(b)(1) of the Act, 21 U.S.C.section 360bbb-3(b)(1), unless the authorization is terminated  or revoked sooner.       Influenza A by PCR NEGATIVE NEGATIVE Final   Influenza B by PCR NEGATIVE NEGATIVE Final    Comment: (NOTE) The Xpert Xpress SARS-CoV-2/FLU/RSV plus assay is intended as an aid in the diagnosis of influenza from Nasopharyngeal swab specimens and should not be used as a sole basis for treatment. Nasal washings and aspirates are unacceptable for Xpert Xpress SARS-CoV-2/FLU/RSV testing.  Fact Sheet for Patients: BloggerCourse.com  Fact Sheet for Healthcare Providers: SeriousBroker.it  This test is not yet approved or cleared by the [place] FDA and has been authorized for detection and/or diagnosis of SARS-CoV-2 by FDA under an Emergency Use Authorization (EUA). This EUA will remain in effect (meaning this test can be used) for the duration of the COVID-19 declaration under Section 564(b)(1) of the Act, 21 U.S.C. section 360bbb-3(b)(1), unless the authorization is terminated or revoked.     Resp Syncytial Virus by PCR NEGATIVE NEGATIVE Final    Comment: (NOTE) Fact Sheet for Patients: BloggerCourse.com  Fact Sheet for Healthcare  Providers: SeriousBroker.it  This test is not yet approved or cleared by the [place] FDA and has been authorized for detection and/or diagnosis of SARS-CoV-2 by FDA under an Emergency Use Authorization (EUA). This EUA will remain in effect (meaning this test can be used) for the duration of the COVID-19 declaration under Section 564(b)(1) of the Act, 21 U.S.C. section 360bbb-3(b)(1), unless the authorization is terminated or revoked.  Performed at [facility], [address] [place], [place] [address]   Respiratory (~20 pathogens) panel by PCR     Status: None   Collection Time: 07/01/23  9:09 AM   Specimen: Nasopharyngeal Swab; Respiratory  Result Value Ref Range Status   Adenovirus NOT DETECTED NOT DETECTED Final   Coronavirus 229E NOT DETECTED NOT DETECTED Final    Comment: (NOTE) The Coronavirus on the Respiratory Panel, DOES NOT test for the novel  Coronavirus (2019 nCoV)    Coronavirus HKU1 NOT DETECTED NOT DETECTED Final   Coronavirus NL63 NOT DETECTED NOT DETECTED Final   Coronavirus OC43 NOT DETECTED NOT DETECTED Final   Metapneumovirus NOT DETECTED NOT DETECTED Final   Rhinovirus / Enterovirus NOT DETECTED NOT DETECTED Final   Influenza A NOT DETECTED NOT DETECTED Final   Influenza B NOT DETECTED NOT DETECTED Final   Parainfluenza Virus 1 NOT DETECTED NOT DETECTED Final   Parainfluenza Virus 2 NOT DETECTED NOT DETECTED Final   Parainfluenza Virus 3 NOT DETECTED NOT DETECTED Final   Parainfluenza Virus 4 NOT DETECTED NOT DETECTED Final   Respiratory Syncytial Virus NOT DETECTED NOT DETECTED Final   Bordetella pertussis NOT DETECTED NOT DETECTED Final   Bordetella Parapertussis NOT DETECTED NOT DETECTED Final  Chlamydophila pneumoniae NOT DETECTED NOT DETECTED Final   Mycoplasma pneumoniae NOT DETECTED NOT DETECTED Final    Comment: Performed at [facility], [address] [place], [place] [address]      Labs: Basic Metabolic Panel: Recent Labs  Lab 06/30/23 2232 07/01/23 0517 07/02/23 0537  NA 136  --  136  K 4.1  --  4.7  CL 97*  --  100  CO2 26  --  26  GLUCOSE 145*  --  103*  BUN 17  --  14  CREATININE 1.20 1.08 0.93  CALCIUM 8.7*  --  8.7*   Liver Function Tests: No results for input(s): "AST", "ALT", "ALKPHOS", "BILITOT", "PROT", "ALBUMIN" in the last 168 hours. No results for input(s): "LIPASE", "AMYLASE" in the last 168 hours. No results for input(s): "AMMONIA" in the last 168 hours. CBC: Recent Labs  Lab 06/30/23 2232 07/01/23 0517 07/02/23 0537  WBC 17.9* 13.2* 17.2*  HGB 7.6* 7.5* 7.8*  HCT 25.8* 25.7* 24.8*  MCV 92.1 92.8 88.3  PLT 490* 426* 433*   Cardiac Enzymes: No results for input(s): "CKTOTAL", "CKMB", "CKMBINDEX", "TROPONINI" in the last 168 hours. BNP: BNP (last 3 results) No results for input(s): "BNP" in the last 8760 hours.  ProBNP (last 3 results) No results for input(s): "PROBNP" in the last 8760 hours.  CBG: No results for input(s): "GLUCAP" in the last 168 hours.     Signed:  Silvano Bilis MD.  [facility] 07/02/2023, 10:31 AM

## 2023-07-02 NOTE — TOC Transition Note (Signed)
Transition of Care [facility]) - CM/SW Discharge Note   Patient Details  Name: Drew Holt MRN: [id number] Date of Birth: Aug 26, 1956  [facility] of [facility]) CM/SW Contact:  Allena Katz, LCSW Phone Number: 07/02/2023, 10:48 AM   Clinical Narrative:   Pt discharging home with [facility] CSW spoke with patient about HH he does not have a preference. [facility] arranged for PT/OT. CSW spoke with cheryl. Daughter [name] requesting assistance for them to find him ALF in future. CSW spoke with patient and he is fine with this. He lives at home with his daughter [name] but would like [place] to call [name] with resources for ALF. CSW provided contact information for [facility] to daughter [name]. Pt reports he has a BSC at home and would just needs a Rollator. Rollator ordered through [facility] to be delivered to patients room.    Final next level of care: Home w Home Health Services Barriers to Discharge: Barriers Resolved   Patient Goals and CMS Choice CMS Medicare.gov Compare Post Acute Care list provided to:: Patient Choice offered to / list presented to : Patient  Discharge Placement                      Patient and family notified of of transfer: 07/02/23  Discharge Plan and Services Additional resources added to the After Visit Summary for                  DME Arranged: Walker rolling with seat DME Agency: [facility] Date DME Agency Contacted: 07/02/23   Representative spoke with at DME Agency: Marthann Schiller HH Arranged: PT, OT HH Agency: [facility] Date [facility] Agency Contacted: 07/02/23   Representative spoke with at [facility] Agency: Elnita Maxwell  Social Determinants of Health (SDOH) Interventions SDOH Screenings   Food Insecurity: No Food Insecurity (07/01/2023)  Housing: Low Risk  (07/01/2023)  Transportation Needs: No Transportation Needs (07/01/2023)  Utilities: Not At Risk (07/01/2023)  Financial Resource Strain: Low Risk  (10/23/2022)   Received from [facility]  Tobacco Use: Low Risk  (06/30/2023)     Readmission Risk Interventions     No data to display

## 2023-07-02 NOTE — Care Management CC44 (Signed)
Condition Code 44 Documentation Completed  Patient Details  Name: Drew Holt MRN: [id number] Date of Birth: [birth year]   Condition Code 44 given:  Yes Patient signature on Condition Code 44 notice:  Yes Documentation of 2 MD's agreement:  Yes Code 44 added to claim:  Yes    Allena Katz, LCSW 07/02/2023, 10:59 AM

## 2023-07-03 LAB — HCV AB W REFLEX TO QUANT PCR: HCV Ab: NONREACTIVE

## 2023-07-03 LAB — HCV INTERPRETATION

## 2023-07-06 LAB — CULTURE, BLOOD (ROUTINE X 2)
Culture: NO GROWTH
Culture: NO GROWTH
Special Requests: ADEQUATE
Special Requests: ADEQUATE

## 2024-08-09 ENCOUNTER — Other Ambulatory Visit: Payer: Self-pay | Admitting: Obstetrics and Gynecology

## 2024-10-06 ENCOUNTER — Other Ambulatory Visit: Payer: Self-pay

## 2024-10-06 ENCOUNTER — Inpatient Hospital Stay
Admission: EM | Admit: 2024-10-06 | Discharge: 2024-10-09 | DRG: 871 | Disposition: A | Discharge status: Home Health | Attending: Internal Medicine | Admitting: Internal Medicine

## 2024-10-06 DIAGNOSIS — E871 Hypo-osmolality and hyponatremia: Secondary | ICD-10-CM | POA: Diagnosis present

## 2024-10-06 DIAGNOSIS — D649 Anemia, unspecified: Secondary | ICD-10-CM | POA: Insufficient documentation

## 2024-10-06 DIAGNOSIS — Z1152 Encounter for screening for COVID-19: Secondary | ICD-10-CM

## 2024-10-06 DIAGNOSIS — A419 Sepsis, unspecified organism: Principal | ICD-10-CM | POA: Diagnosis present

## 2024-10-06 DIAGNOSIS — E43 Unspecified severe protein-calorie malnutrition: Secondary | ICD-10-CM | POA: Diagnosis present

## 2024-10-06 DIAGNOSIS — D529 Folate deficiency anemia, unspecified: Secondary | ICD-10-CM | POA: Diagnosis present

## 2024-10-06 DIAGNOSIS — R051 Acute cough: Secondary | ICD-10-CM

## 2024-10-06 DIAGNOSIS — Z833 Family history of diabetes mellitus: Secondary | ICD-10-CM

## 2024-10-06 DIAGNOSIS — R1310 Dysphagia, unspecified: Secondary | ICD-10-CM | POA: Diagnosis present

## 2024-10-06 DIAGNOSIS — Z681 Body mass index (BMI) 19 or less, adult: Secondary | ICD-10-CM

## 2024-10-06 DIAGNOSIS — R110 Nausea: Secondary | ICD-10-CM

## 2024-10-06 DIAGNOSIS — D519 Vitamin B12 deficiency anemia, unspecified: Secondary | ICD-10-CM | POA: Diagnosis present

## 2024-10-06 DIAGNOSIS — E46 Unspecified protein-calorie malnutrition: Secondary | ICD-10-CM | POA: Diagnosis present

## 2024-10-06 DIAGNOSIS — E872 Acidosis, unspecified: Secondary | ICD-10-CM | POA: Diagnosis present

## 2024-10-06 DIAGNOSIS — N179 Acute kidney failure, unspecified: Secondary | ICD-10-CM | POA: Diagnosis present

## 2024-10-06 DIAGNOSIS — D75838 Other thrombocytosis: Secondary | ICD-10-CM | POA: Diagnosis present

## 2024-10-06 DIAGNOSIS — J189 Pneumonia, unspecified organism: Principal | ICD-10-CM | POA: Diagnosis present

## 2024-10-06 DIAGNOSIS — R0603 Acute respiratory distress: Secondary | ICD-10-CM

## 2024-10-06 DIAGNOSIS — G629 Polyneuropathy, unspecified: Secondary | ICD-10-CM | POA: Diagnosis present

## 2024-10-06 DIAGNOSIS — R6520 Severe sepsis without septic shock: Secondary | ICD-10-CM | POA: Diagnosis present

## 2024-10-06 DIAGNOSIS — R2689 Other abnormalities of gait and mobility: Secondary | ICD-10-CM | POA: Diagnosis present

## 2024-10-06 DIAGNOSIS — G6289 Other specified polyneuropathies: Secondary | ICD-10-CM | POA: Diagnosis present

## 2024-10-06 MED ORDER — LACTATED RINGERS IV BOLUS
1000.0000 mL | Freq: Once | INTRAVENOUS | Status: AC
  Administered 2024-10-07: 1000 mL via INTRAVENOUS

## 2024-10-06 MED ORDER — ONDANSETRON HCL 4 MG/2ML IJ SOLN
4.0000 mg | Freq: Once | INTRAMUSCULAR | Status: AC
  Administered 2024-10-07: 4 mg via INTRAVENOUS
  Filled 2024-10-06: qty 2

## 2024-10-06 NOTE — ED Triage Notes (Signed)
 Pt [place] ambulance [place]/ Per Pt he has been nauseous; pt is dry heaving but no vomiting; pt has had cough x3 days;

## 2024-10-06 NOTE — ED Provider Notes (Signed)
 "  [facility] Provider Note   Event Date/Time   First MD Initiated Contact with Patient 10/06/24 2337     (approximate) History  Nausea and Cough  HPI Drew Holt is a 69 y.o. male with stated past medical history of neuropathy and recurrent pneumonia who presents via EMS complaining of increased cough with dry heaving that is worsening over the last 3 days.  Patient states that it feels similar to when he has had pneumonia in the past.  Patient states that cough began approxi-1 week prior to arrival with some production however increased production over the last 3 days with associated dry heaving that began today.  Patient denies any actual vomiting.  Patient states that he took 1 Zofran  with no relief of his symptoms and therefore called EMS. ROS: Patient currently denies any vision changes, tinnitus, difficulty speaking, facial droop, sore throat, chest pain, abdominal pain, vomiting/diarrhea, dysuria, or weakness/numbness/paresthesias in any extremity   Physical Exam  Triage Vital Signs: ED Triage Vitals  Encounter Vitals Group     BP --      Girls Systolic BP Percentile --      Girls Diastolic BP Percentile --      Boys Systolic BP Percentile --      Boys Diastolic BP Percentile --      Pulse Rate 10/06/24 2331 (!) 120     Resp 10/06/24 2331 (!) 24     Temp 10/06/24 2331 99.6 F (37.6 C)     Temp Source 10/06/24 2331 Oral     SpO2 10/06/24 2331 94 %     Weight 10/06/24 2328 86 lb 12.8 oz (39.4 kg)     Height 10/06/24 2329 5' 4 (1.626 m)     Head Circumference --      Peak Flow --      Pain Score --      Pain Loc --      Pain Education --      Exclude from Growth Chart --    Most recent vital signs: Vitals:   10/06/24 2331  Pulse: (!) 120  Resp: (!) 24  Temp: 99.6 F (37.6 C)  SpO2: 94%   General: Awake, oriented x4. CV:  Good peripheral perfusion. Resp:  Increased effort.  CTAB Abd:  No distention. Other:  Elderly cachectic Caucasian male  resting comfortably in no acute distress ED Results / Procedures / Treatments  Labs (all labs ordered are listed, but only abnormal results are displayed) Labs Reviewed  COMPREHENSIVE METABOLIC PANEL WITH GFR - Abnormal; Notable for the following components:      Result Value   Chloride 95 (*)    Glucose, Bld 128 (*)    Creatinine, Ser 1.54 (*)    Total Protein 8.7 (*)    GFR, Estimated 49 (*)    All other components within normal limits  CBC WITH DIFFERENTIAL/PLATELET - Abnormal; Notable for the following components:   WBC 31.4 (*)    RBC 3.25 (*)    Hemoglobin 10.0 (*)    HCT 31.5 (*)    Platelets 423 (*)    All other components within normal limits  LIPASE, BLOOD - Abnormal; Notable for the following components:   Lipase <10 (*)    All other components within normal limits  RESP PANEL BY RT-PCR (RSV, FLU A&B, COVID)  RVPGX2  CULTURE, BLOOD (ROUTINE X 2)  CULTURE, BLOOD (ROUTINE X 2)  LACTIC ACID, PLASMA  LACTIC ACID, PLASMA  PROTIME-INR  EKG ED ECG REPORT I, Artist MARLA Kerns, the attending physician, personally viewed and interpreted this ECG. Date: 10/06/2024 EKG Time: 2334 Rate: 119 Rhythm: Tachycardic sinus rhythm QRS Axis: normal Intervals: normal ST/T Wave abnormalities: normal Narrative Interpretation: Tachycardic sinus rhythm.  No evidence of acute ischemia RADIOLOGY ED MD interpretation: Single view portable chest x-ray shows ill-defined airspace opacity in the left midlung favoring left lung pneumonia - All radiology independently interpreted and agree with radiology assessment Official radiology report(s): DG Chest Port 1 View Result Date: 10/07/2024 EXAM: 1 VIEW(S) XRAY OF THE CHEST 10/07/2024 12:15:52 AM COMPARISON: CT chest dated 07/01/2023. CLINICAL HISTORY: cough, fever FINDINGS: LUNGS AND PLEURA: Ill-defined airspace opacity in left mid lung, favoring left lung pneumonia. No pleural effusion. No pneumothorax. Follow-up chest radiographs are suggested in  4-6 weeks. HEART AND MEDIASTINUM: No acute abnormality of the cardiac and mediastinal silhouettes. BONES AND SOFT TISSUES: No acute osseous abnormality. IMPRESSION: 1. Ill-defined airspace opacity in the left mid lung, favoring left lung pneumonia. 2. Follow-up chest radiographs are suggested in 4-6 weeks to document clearance. Electronically signed by: Pinkie Pebbles MD 10/07/2024 12:19 AM EST RP Workstation: [id number]   PROCEDURES: Critical Care performed: Yes, see critical care procedure note(s) Procedures CRITICAL CARE Performed by: Eldonna Neuenfeldt K Maddie Brazier  Total critical care time: 33 minutes  Critical care time was exclusive of separately billable procedures and treating other patients.  Critical care was necessary to treat or prevent imminent or life-threatening deterioration.  Critical care was time spent personally by me on the following activities: development of treatment plan with patient and/or surrogate as well as nursing, discussions with consultants, evaluation of patient's response to treatment, examination of patient, obtaining history from patient or surrogate, ordering and performing treatments and interventions, ordering and review of laboratory studies, ordering and review of radiographic studies, pulse oximetry and re-evaluation of patient's condition.  MEDICATIONS ORDERED IN ED: Medications  lactated ringers  infusion (has no administration in time range)  lactated ringers  bolus 1,000 mL (has no administration in time range)    And  lactated ringers  bolus 250 mL (has no administration in time range)  cefTRIAXone  (ROCEPHIN ) 2 g in sodium chloride  0.9 % 100 mL IVPB (has no administration in time range)  azithromycin  (ZITHROMAX ) 500 mg in sodium chloride  0.9 % 250 mL IVPB (has no administration in time range)  ondansetron  (ZOFRAN ) injection 4 mg (4 mg Intravenous Given 10/07/24 0010)  lactated ringers  bolus 1,000 mL (1,000 mLs Intravenous New Bag/Given 10/07/24 0009)    IMPRESSION / MDM / ASSESSMENT AND PLAN / ED COURSE  I reviewed the triage vital signs and the nursing notes.                             The patient is on the cardiac monitor to evaluate for evidence of arrhythmia and/or significant heart rate changes. Patient's presentation is most consistent with acute presentation with potential threat to life or bodily function. Patient is a 69 year old male with the above-stated past medical history presents complaining of worsening cough and dry heaves over the last 3 days DDx: Pneumonia, COPD exacerbation, pulmonary edema, viral upper respiratory infection Plan: Sepsis order set initiated with 30 cc/kg of LR as well as azithromycin  and Rocephin   Laboratory evaluation significant for leukocytosis of 31 with a left-sided pneumonia.  Patient also has worsening creatinine to 1.5.  Patient does have mild respiratory distress that improved with 2 L nasal cannula.  Given patient's respiratory  distress and signs/symptoms of sepsis, patient will require admission to the internal medicine service for further evaluation and management.   FINAL CLINICAL IMPRESSION(S) / ED DIAGNOSES   Final diagnoses:  Pneumonia of left lower lobe due to infectious organism  Respiratory distress  Acute cough  Nausea   Rx / DC Orders   ED Discharge Orders     None      Note:  This document was prepared using Dragon voice recognition software and may include unintentional dictation errors.   Sowmya Partridge K, MD 10/07/24 [phone]  "

## 2024-10-07 ENCOUNTER — Encounter: Payer: Self-pay | Admitting: Internal Medicine

## 2024-10-07 ENCOUNTER — Emergency Department

## 2024-10-07 DIAGNOSIS — A419 Sepsis, unspecified organism: Secondary | ICD-10-CM | POA: Diagnosis present

## 2024-10-07 DIAGNOSIS — E872 Acidosis, unspecified: Secondary | ICD-10-CM | POA: Diagnosis present

## 2024-10-07 DIAGNOSIS — R6520 Severe sepsis without septic shock: Secondary | ICD-10-CM

## 2024-10-07 DIAGNOSIS — D75838 Other thrombocytosis: Secondary | ICD-10-CM | POA: Diagnosis present

## 2024-10-07 DIAGNOSIS — E43 Unspecified severe protein-calorie malnutrition: Secondary | ICD-10-CM | POA: Diagnosis present

## 2024-10-07 DIAGNOSIS — Z681 Body mass index (BMI) 19 or less, adult: Secondary | ICD-10-CM | POA: Diagnosis not present

## 2024-10-07 DIAGNOSIS — J189 Pneumonia, unspecified organism: Secondary | ICD-10-CM | POA: Diagnosis present

## 2024-10-07 DIAGNOSIS — E871 Hypo-osmolality and hyponatremia: Secondary | ICD-10-CM | POA: Diagnosis present

## 2024-10-07 DIAGNOSIS — R1310 Dysphagia, unspecified: Secondary | ICD-10-CM | POA: Diagnosis present

## 2024-10-07 DIAGNOSIS — Z833 Family history of diabetes mellitus: Secondary | ICD-10-CM | POA: Diagnosis not present

## 2024-10-07 DIAGNOSIS — N179 Acute kidney failure, unspecified: Secondary | ICD-10-CM

## 2024-10-07 DIAGNOSIS — D519 Vitamin B12 deficiency anemia, unspecified: Secondary | ICD-10-CM | POA: Diagnosis present

## 2024-10-07 DIAGNOSIS — D529 Folate deficiency anemia, unspecified: Secondary | ICD-10-CM | POA: Diagnosis present

## 2024-10-07 DIAGNOSIS — R2689 Other abnormalities of gait and mobility: Secondary | ICD-10-CM | POA: Diagnosis present

## 2024-10-07 DIAGNOSIS — G629 Polyneuropathy, unspecified: Secondary | ICD-10-CM | POA: Diagnosis present

## 2024-10-07 DIAGNOSIS — Z1152 Encounter for screening for COVID-19: Secondary | ICD-10-CM | POA: Diagnosis not present

## 2024-10-07 DIAGNOSIS — D649 Anemia, unspecified: Secondary | ICD-10-CM | POA: Insufficient documentation

## 2024-10-07 LAB — CBC
HCT: 28.1 % — ABNORMAL LOW (ref 39.0–52.0)
Hemoglobin: 9 g/dL — ABNORMAL LOW (ref 13.0–17.0)
MCH: 31.7 pg (ref 26.0–34.0)
MCHC: 32 g/dL (ref 30.0–36.0)
MCV: 98.9 fL (ref 80.0–100.0)
Platelets: 376 K/uL (ref 150–400)
RBC: 2.84 MIL/uL — ABNORMAL LOW (ref 4.22–5.81)
RDW: 13.9 % (ref 11.5–15.5)
WBC: 34.5 K/uL — ABNORMAL HIGH (ref 4.0–10.5)
nRBC: 0 % (ref 0.0–0.2)

## 2024-10-07 LAB — PHOSPHORUS: Phosphorus: 2.9 mg/dL (ref 2.5–4.6)

## 2024-10-07 LAB — HEPATIC FUNCTION PANEL
ALT: 9 U/L (ref 0–44)
AST: 18 U/L (ref 15–41)
Albumin: 3.7 g/dL (ref 3.5–5.0)
Alkaline Phosphatase: 95 U/L (ref 38–126)
Bilirubin, Direct: 0.5 mg/dL — ABNORMAL HIGH (ref 0.0–0.2)
Indirect Bilirubin: 0.3 mg/dL (ref 0.3–0.9)
Total Bilirubin: 0.8 mg/dL (ref 0.0–1.2)
Total Protein: 8.7 g/dL — ABNORMAL HIGH (ref 6.5–8.1)

## 2024-10-07 LAB — PROTIME-INR
INR: 1.3 — ABNORMAL HIGH (ref 0.8–1.2)
Prothrombin Time: 17.2 s — ABNORMAL HIGH (ref 11.4–15.2)

## 2024-10-07 LAB — COMPREHENSIVE METABOLIC PANEL WITH GFR
ALT: 9 U/L (ref 0–44)
AST: 18 U/L (ref 15–41)
Albumin: 3.8 g/dL (ref 3.5–5.0)
Alkaline Phosphatase: 96 U/L (ref 38–126)
Anion gap: 15 (ref 5–15)
BUN: 19 mg/dL (ref 8–23)
CO2: 26 mmol/L (ref 22–32)
Calcium: 9.1 mg/dL (ref 8.9–10.3)
Chloride: 95 mmol/L — ABNORMAL LOW (ref 98–111)
Creatinine, Ser: 1.54 mg/dL — ABNORMAL HIGH (ref 0.61–1.24)
GFR, Estimated: 49 mL/min — ABNORMAL LOW
Glucose, Bld: 128 mg/dL — ABNORMAL HIGH (ref 70–99)
Potassium: 4.3 mmol/L (ref 3.5–5.1)
Sodium: 135 mmol/L (ref 135–145)
Total Bilirubin: 0.9 mg/dL (ref 0.0–1.2)
Total Protein: 8.7 g/dL — ABNORMAL HIGH (ref 6.5–8.1)

## 2024-10-07 LAB — VITAMIN B12: Vitamin B-12: 350 pg/mL (ref 180–914)

## 2024-10-07 LAB — CBC WITH DIFFERENTIAL/PLATELET
Abs Immature Granulocytes: 0.24 K/uL — ABNORMAL HIGH (ref 0.00–0.07)
Basophils Absolute: 0.1 K/uL (ref 0.0–0.1)
Basophils Relative: 0 %
Eosinophils Absolute: 0 K/uL (ref 0.0–0.5)
Eosinophils Relative: 0 %
HCT: 31.5 % — ABNORMAL LOW (ref 39.0–52.0)
Hemoglobin: 10 g/dL — ABNORMAL LOW (ref 13.0–17.0)
Immature Granulocytes: 1 %
Lymphocytes Relative: 4 %
Lymphs Abs: 1.1 K/uL (ref 0.7–4.0)
MCH: 30.8 pg (ref 26.0–34.0)
MCHC: 31.7 g/dL (ref 30.0–36.0)
MCV: 96.9 fL (ref 80.0–100.0)
Monocytes Absolute: 2.4 K/uL — ABNORMAL HIGH (ref 0.1–1.0)
Monocytes Relative: 8 %
Neutro Abs: 27.6 K/uL — ABNORMAL HIGH (ref 1.7–7.7)
Neutrophils Relative %: 87 %
Platelets: 423 K/uL — ABNORMAL HIGH (ref 150–400)
RBC: 3.25 MIL/uL — ABNORMAL LOW (ref 4.22–5.81)
RDW: 13.9 % (ref 11.5–15.5)
Smear Review: NORMAL
WBC: 31.4 K/uL — ABNORMAL HIGH (ref 4.0–10.5)
nRBC: 0 % (ref 0.0–0.2)

## 2024-10-07 LAB — LACTIC ACID, PLASMA
Lactic Acid, Venous: 1.6 mmol/L (ref 0.5–1.9)
Lactic Acid, Venous: 2.4 mmol/L (ref 0.5–1.9)
Lactic Acid, Venous: 1.6 mmol/L (ref 0.5–1.9)

## 2024-10-07 LAB — BASIC METABOLIC PANEL WITH GFR
Anion gap: 12 (ref 5–15)
BUN: 15 mg/dL (ref 8–23)
CO2: 26 mmol/L (ref 22–32)
Calcium: 8.6 mg/dL — ABNORMAL LOW (ref 8.9–10.3)
Chloride: 98 mmol/L (ref 98–111)
Creatinine, Ser: 1.24 mg/dL (ref 0.61–1.24)
GFR, Estimated: >60 mL/min
Glucose, Bld: 122 mg/dL — ABNORMAL HIGH (ref 70–99)
Potassium: 4.8 mmol/L (ref 3.5–5.1)
Sodium: 136 mmol/L (ref 135–145)

## 2024-10-07 LAB — RETICULOCYTES
Immature Retic Fract: 8.4 % (ref 2.3–15.9)
RBC.: 3.27 MIL/uL — ABNORMAL LOW (ref 4.22–5.81)
Retic Count, Absolute: 34 K/uL (ref 19.0–186.0)
Retic Ct Pct: 1 % (ref 0.4–3.1)

## 2024-10-07 LAB — IRON AND TIBC
Iron: 11 ug/dL — ABNORMAL LOW (ref 45–182)
Saturation Ratios: 7 % — ABNORMAL LOW (ref 17.9–39.5)
TIBC: 169 ug/dL — ABNORMAL LOW (ref 250–450)
UIBC: 158 ug/dL

## 2024-10-07 LAB — RESP PANEL BY RT-PCR (RSV, FLU A&B, COVID)  RVPGX2
Influenza A by PCR: NEGATIVE
Influenza B by PCR: NEGATIVE
Resp Syncytial Virus by PCR: NEGATIVE
SARS Coronavirus 2 by RT PCR: NEGATIVE

## 2024-10-07 LAB — LIPASE, BLOOD: Lipase: <10 U/L — ABNORMAL LOW (ref 11–51)

## 2024-10-07 LAB — HIV ANTIBODY (ROUTINE TESTING W REFLEX): HIV Screen 4th Generation wRfx: NONREACTIVE

## 2024-10-07 LAB — FOLATE: Folate: <3 ng/mL — ABNORMAL LOW

## 2024-10-07 LAB — FERRITIN: Ferritin: 801 ng/mL — ABNORMAL HIGH (ref 24–336)

## 2024-10-07 MED ORDER — ENOXAPARIN SODIUM 30 MG/0.3ML IJ SOSY
30.0000 mg | PREFILLED_SYRINGE | INTRAMUSCULAR | Status: DC
  Administered 2024-10-07 – 2024-10-09 (×3): 30 mg via SUBCUTANEOUS
  Filled 2024-10-07 (×3): qty 0.3

## 2024-10-07 MED ORDER — SODIUM CHLORIDE 0.9 % IV SOLN
500.0000 mg | Freq: Once | INTRAVENOUS | Status: AC
  Administered 2024-10-07: 500 mg via INTRAVENOUS
  Filled 2024-10-07: qty 5

## 2024-10-07 MED ORDER — SODIUM CHLORIDE 0.9 % IV SOLN
12.5000 mg | Freq: Four times a day (QID) | INTRAVENOUS | Status: DC | PRN
  Administered 2024-10-07: 12.5 mg via INTRAVENOUS
  Filled 2024-10-07 (×2): qty 0.5
  Filled 2024-10-07: qty 12.5

## 2024-10-07 MED ORDER — SODIUM CHLORIDE 0.9 % IV SOLN
2.0000 g | INTRAVENOUS | Status: DC
  Administered 2024-10-07 – 2024-10-08 (×2): 2 g via INTRAVENOUS
  Filled 2024-10-07 (×2): qty 20

## 2024-10-07 MED ORDER — ONDANSETRON HCL 4 MG PO TABS
4.0000 mg | ORAL_TABLET | Freq: Four times a day (QID) | ORAL | Status: DC | PRN
  Administered 2024-10-07: 4 mg via ORAL
  Filled 2024-10-07: qty 1

## 2024-10-07 MED ORDER — ACETAMINOPHEN 650 MG RE SUPP: 650.0000 mg | Freq: Four times a day (QID) | RECTAL | Status: DC | PRN

## 2024-10-07 MED ORDER — FERROUS SULFATE 325 (65 FE) MG PO TABS
325.0000 mg | ORAL_TABLET | ORAL | Status: DC
  Administered 2024-10-07 – 2024-10-09 (×2): 325 mg via ORAL
  Filled 2024-10-07 (×2): qty 1

## 2024-10-07 MED ORDER — BOOST / RESOURCE BREEZE PO LIQD CUSTOM
1.0000 | Freq: Three times a day (TID) | ORAL | Status: DC
  Administered 2024-10-07: 1 via ORAL
  Administered 2024-10-08: 237 mL via ORAL
  Administered 2024-10-09: 1 via ORAL

## 2024-10-07 MED ORDER — HYDROCODONE-ACETAMINOPHEN 5-325 MG PO TABS
1.0000 | ORAL_TABLET | ORAL | Status: DC | PRN
  Administered 2024-10-07 – 2024-10-09 (×3): 1 via ORAL
  Filled 2024-10-07 (×3): qty 1

## 2024-10-07 MED ORDER — GUAIFENESIN-DM 100-10 MG/5ML PO SYRP
5.0000 mL | ORAL_SOLUTION | ORAL | Status: DC | PRN
  Administered 2024-10-07 – 2024-10-08 (×2): 5 mL via ORAL
  Filled 2024-10-07 (×2): qty 10

## 2024-10-07 MED ORDER — LACTATED RINGERS IV BOLUS (SEPSIS)
1000.0000 mL | Freq: Once | INTRAVENOUS | Status: AC
  Administered 2024-10-07: 1000 mL via INTRAVENOUS

## 2024-10-07 MED ORDER — LACTATED RINGERS IV SOLN: INTRAVENOUS | Status: AC

## 2024-10-07 MED ORDER — SODIUM CHLORIDE 0.9 % IV SOLN
100.0000 mg | Freq: Two times a day (BID) | INTRAVENOUS | Status: DC
  Administered 2024-10-07 – 2024-10-08 (×3): 100 mg via INTRAVENOUS
  Filled 2024-10-07 (×3): qty 100

## 2024-10-07 MED ORDER — ACETAMINOPHEN 325 MG PO TABS: 650.0000 mg | ORAL_TABLET | Freq: Four times a day (QID) | ORAL | Status: DC | PRN

## 2024-10-07 MED ORDER — LACTATED RINGERS IV BOLUS (SEPSIS)
250.0000 mL | Freq: Once | INTRAVENOUS | Status: AC
  Administered 2024-10-07: 250 mL via INTRAVENOUS

## 2024-10-07 MED ORDER — ALBUTEROL SULFATE (2.5 MG/3ML) 0.083% IN NEBU: 2.5000 mg | INHALATION_SOLUTION | RESPIRATORY_TRACT | Status: DC | PRN

## 2024-10-07 MED ORDER — ONDANSETRON HCL 4 MG/2ML IJ SOLN
4.0000 mg | Freq: Four times a day (QID) | INTRAMUSCULAR | Status: DC | PRN
  Administered 2024-10-07 – 2024-10-09 (×4): 4 mg via INTRAVENOUS
  Filled 2024-10-07 (×4): qty 2

## 2024-10-07 MED ORDER — SODIUM CHLORIDE 0.9 % IV SOLN
2.0000 g | Freq: Once | INTRAVENOUS | Status: AC
  Administered 2024-10-07: 2 g via INTRAVENOUS
  Filled 2024-10-07: qty 20

## 2024-10-07 MED ORDER — VITAMIN B-12 1000 MCG PO TABS
1000.0000 ug | ORAL_TABLET | Freq: Every day | ORAL | Status: DC
  Administered 2024-10-07 – 2024-10-09 (×3): 1000 ug via ORAL
  Filled 2024-10-07 (×3): qty 1

## 2024-10-07 MED ORDER — DIPHENHYDRAMINE HCL 25 MG PO TABS: 25.0000 mg | ORAL_TABLET | Freq: Every day | ORAL | Status: DC | PRN

## 2024-10-07 MED ORDER — LACTATED RINGERS IV SOLN
150.0000 mL/h | INTRAVENOUS | Status: AC
  Administered 2024-10-07: 150 mL/h via INTRAVENOUS

## 2024-10-07 NOTE — Progress Notes (Signed)
 CODE SEPSIS - PHARMACY COMMUNICATION  **Broad Spectrum Antibiotics should be administered within 1 hour of Sepsis diagnosis**  Time Code Sepsis Called/Page Received: 0030  Antibiotics Ordered: Azithromycin  and Ceftriaxone   Time of 1st antibiotic administration: 0106  Rankin CANDIE Dills, PharmD, MBA 10/07/2024 12:40 AM

## 2024-10-07 NOTE — Progress Notes (Signed)
 PHARMACIST - PHYSICIAN COMMUNICATION  CONCERNING:  Enoxaparin  (Lovenox ) for DVT Prophylaxis    RECOMMENDATION: Patient was prescribed enoxaprin 40mg  q24 hours for VTE prophylaxis.   Filed Weights   10/06/24 2328  Weight: 39.4 kg (86 lb 12.8 oz)    Body mass index is 14.9 kg/m.  Estimated Creatinine Clearance: 25.6 mL/min (A) (by C-G formula based on SCr of 1.54 mg/dL (H)).  Patient is candidate for enoxaparin  30mg  every 24 hours based on CrCl <49ml/min or Weight <45kg  DESCRIPTION: Pharmacy has adjusted enoxaparin  dose per [facility] policy.  Patient is now receiving enoxaparin  30 mg every 24 hours   Rankin CANDIE Dills, PharmD, [facility] 10/07/2024 2:55 AM

## 2024-10-07 NOTE — Progress Notes (Signed)
 Patient admitted to hospital for increased nausea.  Patient noted to be A/O x 4.  Patient has received antinausea medication to assist with symptom management.  RN able to perform general assessment.  Noted to have PIV's to bilateral forearms.  IVF initiated per MD order.  During current shift, the patient attempted to get out of bed by himself to go to the bathroom.  Per the patient, he slid to the floor.  Denies hitting is head.  Skin remains intact upon inspection.  Hospital NP made aware of falls/incident.  No additional medical interventions required.  RN able to reinforce with the patient on the following:  correct use of call bed at bedside, wait for assistance to get out of bed, use assistive devices for safe ambulation, use floor mat for safety, etc.  RN also reviewed with the patient on the hospital admission information.  Patient has verbalized understanding of all information reviewed, verbally agrees with current hospitalization.

## 2024-10-07 NOTE — Sepsis Progress Note (Addendum)
 Elink monitoring for the code sepsis protocol.   0600: Notified provider of need to order 3rd lactic acid.

## 2024-10-07 NOTE — Assessment & Plan Note (Addendum)
 Gait abnormality due to balance impairment Homebound No longer follows with [facility] neurology-last seen 2018 Supportive care

## 2024-10-07 NOTE — Hospital Course (Addendum)
 SABRA

## 2024-10-07 NOTE — Assessment & Plan Note (Signed)
 Creatinine 1.54 up from 0.93 a year ago Suspect secondary to sepsis Expecting improvement with IV hydration

## 2024-10-07 NOTE — Progress Notes (Signed)
 Initial Nutrition Assessment  DOCUMENTATION CODES:   Underweight, Severe malnutrition in context of chronic illness  INTERVENTION:   -Boost Breeze po TID, each supplement provides 250 kcal and 9 grams of protein  -MVI with minerals daily -RD will follow for diet advancement and adjust supplement regimen as appropriate  NUTRITION DIAGNOSIS:   Severe Malnutrition related to social / environmental circumstances as evidenced by severe muscle depletion, severe fat depletion.  GOAL:   Patient will meet greater than or equal to 90% of their needs  MONITOR:   PO intake, Supplement acceptance, Diet advancement  REASON FOR ASSESSMENT:   Consult Assessment of nutrition requirement/status  ASSESSMENT:   69 y.o. 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  Patient admitted with severe sepsis, community-acquired pneumonia of left lung, and AKI.   Reviewed I/O's: +1.4 L x 24 hours  Per H&P, patient with chronic anemia and history of B-12 deficiency. Iron panel and vitamin B-12 labs low and have been supplemented by MD (325 mg ferrous sulfate  every other day and 1000 mg vitamin B-12 daily).   Patient lying in bed at time of visit. He reports not feeling well and sleeping poorly last night. He expressed frustration that the doctor told me my white blood cell count was 'through the roof'. Also noticed significant coughing throughout visit.   Patient shares that intake has been very poor over the past 3 days, as everything I eat eat just comes right back up. Patient also reports intake is not good at baseline. He shares I eat whatever I have around the  house, but unable to provide specific examples of frequency as to when or what he eats. Per patient, my daughter is not much of a cook and eats very minimally due to this. He admits that his daughter assists with meal preparation and grocery shopping.   Patient reports prior to acute illness, he had a lot of phlegm in his through and that I always cough like this and when I am eating. RD discussed plan for SLP evaluation to ensure swallow safety.   Limited weight history available to assess. Per [facility], weight was 103# at 01/07/24 visit. Patient has experienced a 4.1% weight loss over the past 9 months, which is not significant for time frame. Patient endorses likely weight loss, but unsure how much or in what time frame. He shares that he was always slender-framed; he estimates he has been around 98# for the majority of his adult life.   Discussed importance of good meal and supplement intake to promote healing. RD discussed that prescribed diet may changes based upon SLP evaluation. He is amenable to supplements.   Medications reviewed and include vitamin B-12, ferrous sulfate , and lactated ringers  infusion @ 150 ml/hr.   Labs reviewed. Vitamin B-12 pending. Iron: 11, TIBC: 169, UIBC WDL.   NUTRITION - FOCUSED PHYSICAL EXAM:  Flowsheet Row Most Recent Value  Orbital Region Severe depletion  Upper Arm Region Severe depletion  Thoracic and Lumbar Region Severe depletion  Buccal Region Severe depletion  Temple Region Severe depletion  Clavicle Bone Region Severe depletion  Clavicle and Acromion Bone Region Severe depletion  Scapular Bone Region Severe depletion  Dorsal Hand Severe depletion  Patellar Region Severe depletion  Anterior Thigh Region  Severe depletion  Posterior Calf Region Severe depletion  Edema (RD Assessment) None  Hair Reviewed  Eyes Reviewed  Mouth Reviewed  Skin Reviewed  Nails Reviewed    Diet Order:   Diet Order             Diet clear liquid Room  service appropriate? Yes; Fluid consistency: Thin  Diet effective now                   EDUCATION NEEDS:   Education needs have been addressed  Skin:  Skin Assessment: Reviewed RN Assessment  Last BM:  10/05/24  Height:   Ht Readings from Last 1 Encounters:  10/07/24 5' 4 (1.626 m)    Weight:   Wt Readings from Last 1 Encounters:  10/07/24 44.8 kg    Ideal Body Weight:  54.5 kg  BMI:  Body mass index is 16.95 kg/m.  Estimated Nutritional Needs:   Kcal:  1800-2000  Protein:  90-105 grams  Fluid:  1.8-2.0 L    Margery ORN, RD, LDN, CDCES Registered Dietitian III Certified Diabetes Care and Education Specialist If unable to reach this RD, please use RD Inpatient group chat on secure chat between hours of 8am-4 pm daily

## 2024-10-07 NOTE — H&P (Signed)
 " History and Physical    Patient: Drew Holt FMW:[id number] DOB: 1956/03/26 DOA: 10/06/2024 DOS: the patient was seen and examined on 10/07/2024 PCP: Geralene Levorn ORN, NP  Patient coming from: Home  Chief Complaint:  Chief Complaint  Patient presents with   Nausea   Cough    HPI: Drew Holt is a 69 y.o. 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 He presented with a 3-day history of cough, nausea and dry heaving without vomiting. In the ED tachycardic and tachypneic with a low-grade temp of 99.6, pulse 120, BP 118/81, O2 sats 94% on room air. Labs notable for WBC 31,000 with bands, lactic acid 1.6, respiratory viral panel negative for COVID flu and RSV.  Hemoglobin 10, up from prior baseline of 7 a year ago.  Creatinine 1.54 up from 0.93 a year ago EKG showing sinus tachycardia at 119 Chest x-ray showed airspace opacity left midlung favoring pneumonia Patient started on sepsis fluids, Rocephin  and azithromycin  Admission requested    Past Medical History:  Diagnosis Date   Neuropathy    Past Surgical History:  Procedure Laterality Date   HEMORRHOID SURGERY     Social History:  reports that he has never smoked. He has never used smokeless tobacco. He reports that he does not drink alcohol and does not use drugs.  Allergies[1]  Family History  Problem Relation Age of Onset   Diabetes Mellitus II Sister    Liver disease Sister     Prior to Admission medications  Medication Sig Start Date End Date Taking? Authorizing Provider  cholecalciferol (VITAMIN D3) 25 MCG (1000 UNIT) tablet Take 1,000 Units by mouth daily.   Yes [provider]  cyanocobalamin  (VITAMIN B12) 1000 MCG tablet Take 1 tablet (1,000 mcg total) by mouth daily. 07/02/23  Yes Wouk, Devaughn Sayres, MD  diphenhydrAMINE  (BENADRYL ) 25 MG tablet Take 25-50 mg by mouth daily as needed for allergies or itching.   Yes [provider]  ondansetron  (ZOFRAN -ODT) 4 MG disintegrating tablet Take 1 tablet (4 mg total) by mouth every 8 (eight) hours as needed for nausea or vomiting. 12/01/20  Yes Danford, Lonni SQUIBB, MD  therapeutic multivitamin-minerals [facility]) tablet Take 1 tablet by mouth daily.   Yes [provider]  amoxicillin -clavulanate (AUGMENTIN ) 875-125 MG tablet Take 1 tablet by mouth 2 (two) times daily. Starting on 10/15 Patient not taking: Reported on 10/07/2024 07/02/23   Kandis Devaughn Sayres, MD  azithromycin  (ZITHROMAX ) 250 MG tablet Take two tabs together on 10/15 Patient not taking: Reported on 10/07/2024 07/02/23   Kandis Devaughn Sayres, MD  ferrous sulfate  325 (65 FE) MG EC tablet Take 1 tablet (325 mg total) by mouth every other day. 07/02/23 07/01/24  Wouk, Devaughn Sayres, MD  HYDROcodone -acetaminophen  (NORCO) 10-325 MG tablet Take 1 tablet by mouth every 4 (four) hours as needed for moderate pain or severe pain. Patient not taking: Reported on 10/07/2024    [provider]  naloxone [facility]) nasal spray 4 mg/0.1 mL Place 1 spray into the nose once. Patient not taking: Reported on 10/07/2024 03/07/22   [provider]  pantoprazole  (PROTONIX ) 40 MG tablet Take 1 tablet (40 mg total) by mouth daily. 07/02/23 07/01/24  Wouk, Devaughn Sayres, MD  polyethylene glycol (MIRALAX  / GLYCOLAX )  17 g packet Take 17 g by mouth daily. Patient not taking: Reported on 10/07/2024 12/01/20   Jonel Lonni SQUIBB, MD  sennosides-docusate sodium  (SENOKOT-S) 8.6-50 MG tablet Take 2 tablets by mouth daily as needed for constipation. Patient not taking: Reported on 10/07/2024    [provider]    Physical Exam: Vitals:    10/06/24 2329 10/06/24 2331 10/07/24 0215 10/07/24 0230  BP:    128/88  Pulse:  (!) 120 (!) 111   Resp:  (!) 24 16   Temp:  99.6 F (37.6 C)    TempSrc:  Oral    SpO2:  94% 99%   Weight:      Height: 5' 4 (1.626 m)      Physical Exam Vitals and nursing note reviewed.  Constitutional:      General: He is not in acute distress.    Appearance: He is underweight.     Comments: Coughing a lot and dry heaving  HENT:     Head: Normocephalic and atraumatic.  Cardiovascular:     Rate and Rhythm: Normal rate and regular rhythm.     Heart sounds: Normal heart sounds.  Pulmonary:     Effort: Tachypnea present.     Breath sounds: Rales present.  Abdominal:     Palpations: Abdomen is soft.     Tenderness: There is no abdominal tenderness.  Neurological:     Mental Status: Mental status is at baseline.     Labs on Admission: I have personally reviewed following labs and imaging studies  CBC: Recent Labs  Lab 10/07/24 0001  WBC 31.4*  NEUTROABS 27.6*  HGB 10.0*  HCT 31.5*  MCV 96.9  PLT 423*   Basic Metabolic Panel: Recent Labs  Lab 10/07/24 0001  NA 135  K 4.3  CL 95*  CO2 26  GLUCOSE 128*  BUN 19  CREATININE 1.54*  CALCIUM 9.1   GFR: Estimated Creatinine Clearance: 25.6 mL/min (A) (by C-G formula based on SCr of 1.54 mg/dL (H)). Liver Function Tests: Recent Labs  Lab 10/07/24 0001  AST 18  ALT 9  ALKPHOS 96  BILITOT 0.9  PROT 8.7*  ALBUMIN 3.8   Recent Labs  Lab 10/07/24 0001  LIPASE <10*   No results for input(s): AMMONIA in the last 168 hours. Coagulation Profile: Recent Labs  Lab 10/07/24 0041  INR 1.3*   Cardiac Enzymes: No results for input(s): CKTOTAL, CKMB, CKMBINDEX, TROPONINI in the last 168 hours. BNP (last 3 results) No results for input(s): PROBNP in the last 8760 hours. HbA1C: No results for input(s): HGBA1C in the last 72 hours. CBG: No results for input(s): GLUCAP in the last 168 hours. Lipid Profile: No  results for input(s): CHOL, HDL, LDLCALC, TRIG, CHOLHDL, LDLDIRECT in the last 72 hours. Thyroid Function Tests: No results for input(s): TSH, T4TOTAL, FREET4, T3FREE, THYROIDAB in the last 72 hours. Anemia Panel: No results for input(s): VITAMINB12, FOLATE, FERRITIN, TIBC, IRON, RETICCTPCT in the last 72 hours. Urine analysis:    Component Value Date/Time   COLORURINE YELLOW (A) 11/29/2020 2221   APPEARANCEUR CLEAR (A) 11/29/2020 2221   LABSPEC 1.036 (H) 11/29/2020 2221   PHURINE 5.0 11/29/2020 2221   GLUCOSEU NEGATIVE 11/29/2020 2221   HGBUR MODERATE (A) 11/29/2020 2221   BILIRUBINUR NEGATIVE 11/29/2020 2221   KETONESUR 5 (A) 11/29/2020 2221   PROTEINUR NEGATIVE 11/29/2020 2221   NITRITE NEGATIVE 11/29/2020 2221   LEUKOCYTESUR NEGATIVE 11/29/2020 2221    Radiological Exams on Admission: [facility] Chest [facility]  1 View Result Date: 10/07/2024 EXAM: 1 VIEW(S) XRAY OF THE CHEST 10/07/2024 12:15:52 AM COMPARISON: CT chest dated 07/01/2023. CLINICAL HISTORY: cough, fever FINDINGS: LUNGS AND PLEURA: Ill-defined airspace opacity in left mid lung, favoring left lung pneumonia. No pleural effusion. No pneumothorax. Follow-up chest radiographs are suggested in 4-6 weeks. HEART AND MEDIASTINUM: No acute abnormality of the cardiac and mediastinal silhouettes. BONES AND SOFT TISSUES: No acute osseous abnormality. IMPRESSION: 1. Ill-defined airspace opacity in the left mid lung, favoring left lung pneumonia. 2. Follow-up chest radiographs are suggested in 4-6 weeks to document clearance. Electronically signed by: Pinkie Pebbles MD 10/07/2024 12:19 AM EST RP Workstation: [id number]   Data Reviewed for HPI: Relevant notes from primary care and specialist visits, past discharge summaries as available in EHR, including Care Everywhere. Prior diagnostic testing as pertinent to current admission diagnoses Updated medications and problem lists for reconciliation ED course,  including vitals, labs, imaging, treatment and response to treatment Triage notes, nursing and pharmacy notes and ED provider's notes Notable results as noted above in HPI      Assessment and Plan: * Severe sepsis (HCC) Community-acquired pneumonia left lung Severe sepsis criteria include low-grade temperature, tachycardia and tachypnea with leukocytosis, AKI Sepsis fluids Rocephin  and doxycycline  Albuterol  as needed, antitussives Incentive spirometer Supplemental oxygen if needed  AKI (acute kidney injury) Creatinine 1.54 up from 0.93 a year ago Suspect secondary to sepsis Expecting improvement with IV hydration  Protein calorie malnutrition History of B12 deficiency Chronic anemia BMI 14.9 Hemoglobin 10, up from previous baseline of around 7 a year ago Will get anemia panel Nutritionist eval  Diffuse sensory axonal peripheral polyneuropathy Gait abnormality due to balance impairment Homebound No longer follows with [facility] neurology-last seen 2018 Supportive care    DVT prophylaxis: Lovenox   Consults: none  Advance Care Planning:   Code Status: Prior   Family Communication: none  Disposition Plan: Back to previous home environment  Severity of Illness: The appropriate patient status for this patient is OBSERVATION. Observation status is judged to be reasonable and necessary in order to provide the required intensity of service to ensure the patient's safety. The patient's presenting symptoms, physical exam findings, and initial radiographic and laboratory data in the context of their medical condition is felt to place them at decreased risk for further clinical deterioration. Furthermore, it is anticipated that the patient will be medically stable for discharge from the hospital within 2 midnights of admission.   Author: Delayne LULLA Solian, MD 10/07/2024 2:38 AM  For on call review [email or web address].      [1] No Known Allergies  "

## 2024-10-07 NOTE — TOC CM/SW Note (Signed)
 Transition of Care Department [facility]) has reviewed patient and no TOC needs have been identified at this time.  If new patient transition needs arise, please place a TOC consult.  RN met with patient in his room.  He lives with daughter who provides most of his care as needed.   RNCM inquired about homecare services and he immediately refused anyone to come in his home  He does not drive, daughter picks up meds for him.  States his daughter will take him back home once discharged from hospital.

## 2024-10-07 NOTE — Assessment & Plan Note (Deleted)
 Hemoglobin 10, improved from prior baseline of 7 Will get anemia panel

## 2024-10-07 NOTE — Assessment & Plan Note (Addendum)
 History of B12 deficiency Chronic anemia BMI 14.9 Hemoglobin 10, up from previous baseline of around 7 a year ago Will get anemia panel Nutritionist eval

## 2024-10-07 NOTE — Progress Notes (Signed)
 " Progress Note   Patient: Drew Holt FMW:[id number] DOB: 01-04-1956 DOA: 10/06/2024     0 DOS: the patient was seen and examined on 10/07/2024   Brief hospital course:  From HPI Drew Holt is a 69 y.o. 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 He presented with a 3-day history of cough, nausea and dry heaving without vomiting. In the ED tachycardic and tachypneic with a low-grade temp of 99.6, pulse 120, BP 118/81, O2 sats 94% on room air. Labs notable for WBC 31,000 with bands, lactic acid 1.6, respiratory viral panel negative for COVID flu and RSV.  Hemoglobin 10, up from prior baseline of 7 a year ago.  Creatinine 1.54 up from 0.93 a year ago EKG showing sinus tachycardia at 119 Chest x-ray showed airspace opacity left midlung favoring pneumonia Patient started on sepsis fluids, Rocephin  and azithromycin  Admission requested   Assessment and Plan: Severe sepsis (HCC) Community-acquired pneumonia left lung Severe sepsis criteria include low-grade temperature, tachycardia and tachypnea with leukocytosis, AKI Patient received IV fluid Continue Rocephin  and doxycycline  Albuterol  as needed, antitussives Incentive spirometer Supplemental oxygen if needed   AKI (acute kidney injury) Creatinine 1.54 up from 0.93 a year ago Suspect secondary to sepsis Monitor renal function Continue IV fluid   Protein calorie malnutrition History of B12 deficiency Chronic anemia BMI 14.9 Follow-up on anemia panel   Diffuse sensory axonal peripheral polyneuropathy Gait abnormality due to balance impairment Homebound No longer follows with [facility] neurology-last  seen 2018 Supportive care   DVT prophylaxis: Lovenox    Consults: none   Advance Care Planning:   Code Status: Prior    Family Communication: none   Disposition Plan: Back to previous home environment    Subjective:  Patient seen and examined at bedside this morning Denies nausea vomiting abdominal pain Still having a cough  Physical Exam: General: Elderly male laying in bed HENT:     Head: Normocephalic and atraumatic.  Cardiovascular:     Rate and Rhythm: Normal rate and regular rhythm.     Heart sounds: Normal heart sounds.  Pulmonary: Decreased air entry bilaterally Abdominal:     Palpations: Abdomen is soft.     Tenderness: There is no abdominal tenderness.  Neurological:     Mental Status: Mental status is at baseline.   Vitals:   10/07/24 0604 10/07/24 0700 10/07/24 1024 10/07/24 1152  BP: 129/81 114/71 130/86 114/71  Pulse: (!) 126 (!) 116 (!) 109 (!) 106  Resp: 20  20   Temp: 97.9 F (36.6 C)  98 F (36.7 C) 99.5 F (37.5 C)  TempSrc:   Oral Oral  SpO2: 99%  99% 96%  Weight:      Height:        Data Reviewed: Chest x-ray showing ill-defined airspace opacity in the left lung favoring pneumonia    Latest Ref Rng & Units 10/07/2024    4:07 AM 10/07/2024   12:01 AM 07/02/2023    5:37 AM  CBC  WBC 4.0 - 10.5 K/uL 34.5  31.4  17.2   Hemoglobin 13.0 - 17.0 g/dL 9.0  89.9  7.8   Hematocrit 39.0 - 52.0 % 28.1  31.5  24.8   Platelets 150 - 400 K/uL 376  423  433  Latest Ref Rng & Units 10/07/2024    4:07 AM 10/07/2024   12:01 AM 07/02/2023    5:37 AM  BMP  Glucose 70 - 99 mg/dL 877  871  896   BUN 8 - 23 mg/dL 15  19  14    Creatinine 0.61 - 1.24 mg/dL 8.75  8.45  9.06   Sodium 135 - 145 mmol/L 136  135  136   Potassium 3.5 - 5.1 mmol/L 4.8  4.3  4.7   Chloride 98 - 111 mmol/L 98  95  100   CO2 22 - 32 mmol/L 26  26  26    Calcium 8.9 - 10.3 mg/dL 8.6  9.1  8.7     Family Communication: No family present at bedside  Disposition: Pending  hospital course and PT OT eval Status is: Inpatient  Time spent: 51 minutes  Author: Drue ONEIDA Potter, MD 10/07/2024 2:58 PM  For on call review [email or web address]  "

## 2024-10-07 NOTE — Plan of Care (Signed)

## 2024-10-07 NOTE — Progress Notes (Signed)
 SLP Cancellation Note  Patient Details Name: Nichols Corter MRN: [id number] DOB: 1955-12-04   Cancelled treatment:       Reason Eval/Treat Not Completed: Other (comment) (Patient declined due to severe nausea; attempted water only (for potential oral medications) but again declined noting active dry heaving during visit; will defer and attempt later this date as schedule allows or next date of care)  Rosaline HERO. Krystal KILLIAN, CCC-SLP Speech Language Pathologist [facility]  Rosaline HERO Krystal 10/07/2024, 11:00 AM

## 2024-10-07 NOTE — Plan of Care (Signed)
   Problem: Education: Goal: Knowledge of General Education information will improve Description: Including pain rating scale, medication(s)/side effects and non-pharmacologic comfort measures Outcome: Progressing   Problem: Activity: Goal: Risk for activity intolerance will decrease Outcome: Progressing

## 2024-10-07 NOTE — Assessment & Plan Note (Addendum)
 Community-acquired pneumonia left lung Severe sepsis criteria include low-grade temperature, tachycardia and tachypnea with leukocytosis, AKI Sepsis fluids Rocephin  and doxycycline  Albuterol  as needed, antitussives Incentive spirometer Supplemental oxygen if needed Will get SLP consult to evaluate swallow

## 2024-10-08 DIAGNOSIS — D75838 Other thrombocytosis: Secondary | ICD-10-CM | POA: Insufficient documentation

## 2024-10-08 DIAGNOSIS — A419 Sepsis, unspecified organism: Secondary | ICD-10-CM | POA: Diagnosis not present

## 2024-10-08 DIAGNOSIS — R6520 Severe sepsis without septic shock: Secondary | ICD-10-CM | POA: Diagnosis not present

## 2024-10-08 DIAGNOSIS — J189 Pneumonia, unspecified organism: Secondary | ICD-10-CM | POA: Diagnosis not present

## 2024-10-08 DIAGNOSIS — D529 Folate deficiency anemia, unspecified: Secondary | ICD-10-CM | POA: Insufficient documentation

## 2024-10-08 DIAGNOSIS — E43 Unspecified severe protein-calorie malnutrition: Secondary | ICD-10-CM

## 2024-10-08 DIAGNOSIS — E871 Hypo-osmolality and hyponatremia: Secondary | ICD-10-CM | POA: Insufficient documentation

## 2024-10-08 LAB — BASIC METABOLIC PANEL WITH GFR
Anion gap: 9 (ref 5–15)
BUN: 12 mg/dL (ref 8–23)
CO2: 25 mmol/L (ref 22–32)
Calcium: 8.4 mg/dL — ABNORMAL LOW (ref 8.9–10.3)
Chloride: 97 mmol/L — ABNORMAL LOW (ref 98–111)
Creatinine, Ser: 0.85 mg/dL (ref 0.61–1.24)
GFR, Estimated: >60 mL/min
Glucose, Bld: 91 mg/dL (ref 70–99)
Potassium: 4.7 mmol/L (ref 3.5–5.1)
Sodium: 131 mmol/L — ABNORMAL LOW (ref 135–145)

## 2024-10-08 LAB — CBC WITH DIFFERENTIAL/PLATELET
Abs Immature Granulocytes: 0.21 K/uL — ABNORMAL HIGH (ref 0.00–0.07)
Basophils Absolute: 0 K/uL (ref 0.0–0.1)
Basophils Relative: 0 %
Eosinophils Absolute: 0 K/uL (ref 0.0–0.5)
Eosinophils Relative: 0 %
HCT: 27.9 % — ABNORMAL LOW (ref 39.0–52.0)
Hemoglobin: 9 g/dL — ABNORMAL LOW (ref 13.0–17.0)
Immature Granulocytes: 1 %
Lymphocytes Relative: 5 %
Lymphs Abs: 1.5 K/uL (ref 0.7–4.0)
MCH: 30.7 pg (ref 26.0–34.0)
MCHC: 32.3 g/dL (ref 30.0–36.0)
MCV: 95.2 fL (ref 80.0–100.0)
Monocytes Absolute: 1.4 K/uL — ABNORMAL HIGH (ref 0.1–1.0)
Monocytes Relative: 5 %
Neutro Abs: 24.3 K/uL — ABNORMAL HIGH (ref 1.7–7.7)
Neutrophils Relative %: 89 %
Platelets: 356 K/uL (ref 150–400)
RBC: 2.93 MIL/uL — ABNORMAL LOW (ref 4.22–5.81)
RDW: 14 % (ref 11.5–15.5)
Smear Review: NORMAL
WBC: 27.5 K/uL — ABNORMAL HIGH (ref 4.0–10.5)
nRBC: 0 % (ref 0.0–0.2)

## 2024-10-08 MED ORDER — SODIUM CHLORIDE 0.9 % IV SOLN: INTRAVENOUS | Status: DC

## 2024-10-08 MED ORDER — DOXYCYCLINE HYCLATE 100 MG PO TABS
100.0000 mg | ORAL_TABLET | Freq: Two times a day (BID) | ORAL | Status: DC
  Administered 2024-10-08 – 2024-10-09 (×2): 100 mg via ORAL
  Filled 2024-10-08 (×2): qty 1

## 2024-10-08 MED ORDER — FOLIC ACID 1 MG PO TABS
1.0000 mg | ORAL_TABLET | Freq: Every day | ORAL | Status: DC
  Administered 2024-10-08 – 2024-10-09 (×2): 1 mg via ORAL
  Filled 2024-10-08 (×2): qty 1

## 2024-10-08 MED ORDER — PROMETHAZINE HCL 25 MG PO TABS
12.5000 mg | ORAL_TABLET | Freq: Four times a day (QID) | ORAL | Status: DC | PRN
  Administered 2024-10-08: 12.5 mg via ORAL
  Filled 2024-10-08 (×2): qty 1

## 2024-10-08 NOTE — Progress Notes (Signed)
Per Dr Zhang, dc tele monitoring  

## 2024-10-08 NOTE — Plan of Care (Signed)
  Problem: Clinical Measurements: Goal: Diagnostic test results will improve Outcome: Progressing Goal: Signs and symptoms of infection will decrease Outcome: Progressing   Problem: Respiratory: Goal: Ability to maintain adequate ventilation will improve Outcome: Progressing   Problem: Clinical Measurements: Goal: Ability to maintain clinical measurements within normal limits will improve Outcome: Progressing

## 2024-10-08 NOTE — Progress Notes (Signed)
 " Progress Note   Patient: Drew Holt FMW:[id number] DOB: 04-21-1956 DOA: 10/06/2024     1 DOS: the patient was seen and examined on 10/08/2024   Brief hospital course: Drew Holt is a 69 y.o. 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 Upon arriving to hospital, patient had a signal leukocytosis at 31,000, heart rate 112, respiratory 24, lactic acid peaked at 2.4. Chest x-ray showed left lower lung consolidation.  Patient started on antibiotics with Rocephin  and Zithromax .    Principal Problem:   Severe sepsis (HCC) Active Problems:   CAP (community acquired pneumonia)   Protein calorie malnutrition   AKI (acute kidney injury)   Diffuse sensory axonal peripheral polyneuropathy   Abnormality of gait due to impairment of balance   Chronic anemia   Protein-calorie malnutrition, severe   Folate deficiency anemia   Reactive thrombocytosis   Hyponatremia   Assessment and Plan: * Severe sepsis (HCC) Community-acquired pneumonia left lung Dysphagia Severe sepsis criteria include low-grade temperature, tachycardia and tachypnea with leukocytosis, AKI, lactic acidosis. Patient has received IV fluids, lactic acid level and renal function are normalized. Reviewed patient chest x-ray image, compared to images study performed on 06/2024, currently patient has a left lower lung dense consolidation.  Patient also has hyponatremia.  Will check urine antigen for Legionella as well as strep pneumo. Patient also has significant dysphagia, recurrent pneumonia could be from aspiration.  Speech therapy evaluation obtained. Will  continue current antibiotics for now, may consider followed with Augmentin  at time of discharge.  AKI (acute kidney injury) Hyponatremia. Renal function has normalized following IV fluids.  Mild hyponatremia will continue to monitor.  Protein calorie malnutrition, severe. BMI 14.9 Start protein supplement.  Anemia secondary to B12 and folic acid  deficiency. Start B12 and folic acid  supplement.  Patient has a low iron, but increased ferritin, no need for iron.   Diffuse sensory axonal peripheral polyneuropathy Gait abnormality due to balance impairment Homebound PT eval       Subjective:  Patient still has significant cough, with large amount of mucus.  Complaining dysphagia. Short of breath with exertion  Physical Exam: Vitals:   10/07/24 1940 10/08/24 0012 10/08/24 0410 10/08/24 0742  BP: (!) 107/59 (!) 101/54 (!) 102/58 (!) 99/57  Pulse: (!) 102 95 98 (!) 115  Resp: 18 18 20    Temp: 98.3 F (36.8 C) 98.3 F (36.8 C) 98.3 F (36.8 C) 98.6 F (37 C)  TempSrc:    Oral  SpO2: 95% 97% 98% 96%  Weight:      Height:       General exam: Appears calm and comfortable, severely malnourished. Respiratory system: Decreased breath sounds. Respiratory effort normal. Cardiovascular system: S1 & S2 heard, RRR. No JVD, murmurs, rubs, gallops or clicks. No pedal edema. Gastrointestinal system: Abdomen is nondistended, soft and nontender. No organomegaly or masses felt. Normal bowel sounds heard. Central nervous system: Alert and oriented. No focal neurological deficits. Extremities: Symmetric 5 x 5 power. Skin: No rashes, lesions or ulcers Psychiatry: Judgement and insight appear normal. Mood & affect appropriate.    Data Reviewed:  Reviewed chest x-ray imaging and reports, compared to prior imaging study. Reviewed lab results.  Family Communication: Daughter updated over the  phone.  Disposition: Status is: Inpatient Remains inpatient appropriate because: Severity of  disease, IV treatment     Time spent: 50 minutes  Author: Murvin Mana, MD 10/08/2024 10:50 AM  For on call review [email or web address].    "

## 2024-10-08 NOTE — Evaluation (Addendum)
 Clinical/Bedside Swallow Evaluation Patient Details  Name: Drew Holt MRN: [id number] Date of Birth: 12/22/1955  Today's Date: 10/08/2024 Time: SLP Start Time (ACUTE ONLY): 1130 SLP Stop Time (ACUTE ONLY): 1215 SLP Time Calculation (min) (ACUTE ONLY): 45 min  Past Medical History:  Past Medical History:  Diagnosis Date   Neuropathy    Past Surgical History:  Past Surgical History:  Procedure Laterality Date   HEMORRHOID SURGERY     HPI:  Pt is a 69 y.o. male with medical history significant for diffuse sensory axonal peripheral polyneuropathy with resulting gait impairment/poor mobility previously followed by [facility] neurology,  protein calorie Malnutrition with nutritional deficiencies- BMI 16.95, homebound status due to his neurologic illness, with last hospitalization 06/2023 for multifocal pneumonia, now being admitted with pneumonia left lung.  He presented with a 3-day history of cough, nausea and dry heaving.  Pt reported the dry heaving worsened over last 3 days; he took 1 Zofran  with no relief of his symptoms and therefore called EMS.   CXR: Ill-defined airspace opacity in the left mid lung, favoring left lung  pneumonia.  Prior Chest Imaging was in 06/2023.    Assessment / Plan / Recommendation  Clinical Impression   Pt seen for BSE today. Pt declined BSE yesterday d/t s/s of dry heaving and N/V. Pt awake, sitting up in bed w/ knees drawn to chest. He was feeding himself an icee pop. Pt was verbal but appeared min distracted/mild confusion. He seemed focused on other topics and declined po trials initially- only accepted a few pos after continued encouragement. Lunch ordered together via phone.  On RA, afebrile. WBC trending down.   Pt appears to present w/ grossly functional oropharyngeal phase swallowing of trials accepted w/ No overt oropharyngeal phase dysphagia noted, No overt neuromuscular deficits noted. Pt consumed po trials w/ no immediate, overt clinical s/s of aspiration  during po trials.  Pt appears at reduced risk for aspiration when following general aspiration precautions and using a slightly Cut/chopped meats and foods diet d/t Dentition status. Pt does have challenging factors that could impact oropharyngeal swallowing to include: suspected impact from cognitive baseline; Malnutrition; deconditioning; meal setup dependency; missing dentition. These factors can increase risk for dysphagia as well as decreased oral intake overall.   During po trials, pt consumed consistencies w/ no overt coughing, decline in vocal quality, or change in respiratory presentation during/post trials. Oral phase appeared grossly [facility] w/ timely bolus management, mastication/gumming/mashing, and control of bolus propulsion for A-P transfer for swallowing. Oral clearing achieved w/ all trial consistencies -- moistened, soft foods given. OM Exam appeared [facility] w/ no unilateral weakness noted. Speech Clear. Pt fed self w/ setup support.   Recommend a more Mech Soft/Regular consistency diet w/ well-Cut/Chopped meats, moistened foods; Thin liquids -- pt should sit Fully upright and Hold Cup when drinking. Recommend general aspiration precautions, including small bites/sips slowly. Reduce distractions during meals. Pills WHOLE in Puree if easier for swallowing.  Education given on Pills in Puree; food consistencies and easy to eat options; general aspiration precautions to pt. No further acute ST services indicated at this time. MD/NSG to reconsult if any new needs arise. NSG updated, agreed. MD updated. Recommend Dietician f/u for support. Precautions posted in chart, room.  SLP Visit Diagnosis: Dysphagia, unspecified (R13.10) (suspect impact from cognitive baseline; deconditioning; meal setup dependency; missing dentition)    Aspiration Risk  Mild aspiration risk;Risk for inadequate nutrition/hydration (reduced when following general precautions; unkown esophageal history)    Diet Recommendation  Thin;Dysphagia 3 (mechanical soft) (cut/chopped meats, foods; moistened for ease) = a more Mech Soft/Regular consistency diet w/ well-Cut/Chopped meats, moistened foods; Thin liquids -- pt should sit Fully upright and Hold Cup when drinking. Recommend general aspiration precautions, including small bites/sips slowly. Reduce distractions during meals.   Medication Administration: Whole meds with puree (as needed for ease of swallowing)    Other Recommendations Recommended Consults: Consider GI evaluation;Consider esophageal assessment (?; Dietician f/u) Oral Care Recommendations: Oral care BID;Oral care before and after PO;Staff/trained caregiver to provide oral care (setup)     Swallow Evaluation Recommendations  See above   Assistance Recommended at Discharge  Setup support at meals  Functional Status Assessment Patient has not had a recent decline in their functional status (in oropharyngeal swallowing)  Frequency and Duration  (n/a)   (n/a)       Prognosis Prognosis for improved oropharyngeal function: Fair (-Good though lack of desire for eating foods) Barriers to Reach Goals: Time post onset;Severity of deficits;Behavior;Motivation;Cognitive deficits Barriers/Prognosis Comment: suspect impact from cognitive baseline; deconditioning; meal setup dependency; missing dentition; motivation      Swallow Study   General Date of Onset: 10/06/24 HPI: Pt is a 69 y.o. male with medical history significant for diffuse sensory axonal peripheral polyneuropathy with resulting gait impairment/poor mobility previously followed by [facility] neurology,  protein calorie Malnutrition with nutritional deficiencies- BMI 16.95, homebound status due to his neurologic illness, with last hospitalization 06/2023 for multifocal pneumonia, now being admitted with pneumonia left lung.  He presented with a 3-day history of cough, nausea and dry heaving.  Pt reported the dry heaving worsened over last 3 days; he took 1 Zofran   with no relief of his symptoms and therefore called EMS.   CXR: Ill-defined airspace opacity in the left mid lung, favoring left lung  pneumonia.  Prior Chest Imaging was in 06/2023. Type of Study: Bedside Swallow Evaluation Previous Swallow Assessment: none Diet Prior to this Study: Thin liquids (Level 0) (clear liquid per MD d/t N/V) Temperature Spikes Noted: No (wbc trending down) Respiratory Status: Room air History of Recent Intubation: No Behavior/Cognition: Alert;Cooperative;Confused;Pleasant mood;Distractible;Requires cueing (mildly) Oral Cavity Assessment: Within Functional Limits Oral Care Completed by SLP: Yes (brief) Oral Cavity - Dentition: Missing dentition Vision: Functional for self-feeding Self-Feeding Abilities: Able to feed self;Needs set up (full) Patient Positioning: Upright in bed Baseline Vocal Quality: Normal Volitional Cough: Strong Volitional Swallow: Able to elicit    Oral/Motor/Sensory Function Overall Oral Motor/Sensory Function: Within functional limits (no unilateral weakness)   Ice Chips Ice chips: Not tested   Thin Liquid Thin Liquid: Within functional limits Presentation: Self Fed;Straw (10 trials total w/ strong encouragement) Other Comments: included icee pop spoonfuls    Nectar Thick Nectar Thick Liquid: Not tested   Honey Thick Honey Thick Liquid: Not tested   Puree Puree: Not tested Other Comments: pt declined   Solid     Solid: Within functional limits Presentation: Self Fed (graham crackers - 3 pieces accepted) Other Comments: took his time chewing        Comer Portugal, MS, CCC-SLP Speech Language Pathologist Rehab Services; [facility] [address] [phone] (ascom) Marek Nghiem 10/08/2024,5:59 PM

## 2024-10-08 NOTE — Progress Notes (Signed)
 PHARMACIST - PHYSICIAN COMMUNICATION DR:   Laurita CONCERNING: Antibiotic IV to Oral Route Change Policy  RECOMMENDATION: This patient is receiving doxycycline  by the intravenous route.  Based on criteria approved by the Pharmacy and Therapeutics Committee, the antibiotic(s) is/are being converted to the equivalent oral dose form(s).   DESCRIPTION: These criteria include: Patient being treated for a respiratory tract infection, urinary tract infection, cellulitis or clostridium difficile associated diarrhea if on metronidazole The patient is not neutropenic and does not exhibit a GI malabsorption state The patient is eating (either orally or via tube) and/or has been taking other orally administered medications for a least 24 hours The patient is improving clinically and has a Tmax < 100.5  If you have questions about this conversion, please contact the Pharmacy Department  []   [phone] )  [facility] [x]   [phone] )  [facility] []   [phone] )  [facility] []   [phone] )  [facility] []   [phone] )  [facility]    Lum Mania, PharmD, BCPS

## 2024-10-08 NOTE — Evaluation (Signed)
 Occupational Therapy Evaluation Patient Details Name: Drew Holt MRN: [id number] DOB: 07/02/1956 Today's Date: 10/08/2024   History of Present Illness   Drew Holt is a 69 y.o. 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   Clinical Impressions Drew Holt was seen for OT evaluation this date. Prior to hospital admission, pt was MOD I using RW for limited household tasks. Pt lives with daughter. Pt currently requires SBA + RW for ADL t/f ~5 ft and standing grooming tasks. IND don/doff socks long sitting in bed. Pt reports mobility near baseline, left sitting EOB with alarm on. Pt would benefit from skilled OT to address noted impairments and functional limitations (see below for any additional details). Upon hospital discharge, recommend OT follow up.     If plan is discharge home, recommend the following:   Help with stairs or ramp for entrance     Functional Status Assessment   Patient has had a recent decline in their functional status and demonstrates the ability to make significant improvements in function in a reasonable and predictable amount of time.     Equipment Recommendations   BSC/3in1     Recommendations for Other Services         Precautions/Restrictions   Precautions Precautions: Fall Recall of Precautions/Restrictions: Intact Restrictions Weight Bearing Restrictions Per Provider Order: No     Mobility Bed Mobility Overal bed mobility: Modified Independent                  Transfers Overall transfer level: Needs assistance Equipment used: Rolling walker (2 wheels) Transfers: Sit to/from Stand Sit to  Stand: Supervision                  Balance Overall balance assessment: Needs assistance Sitting-balance support: No upper extremity supported, Feet supported Sitting balance-Leahy Scale: Good     Standing balance support: Single extremity supported, During functional activity Standing balance-Leahy Scale: Fair                             ADL either performed or assessed with clinical judgement   ADL Overall ADL's : Needs assistance/impaired                                       General ADL Comments: SBA + RW for ADL t/f ~5 ft and standing grooming tasks. IND don/doff socks long sitting in bed      Pertinent Vitals/Pain Pain Assessment Pain Assessment: Faces Faces Pain Scale: Hurts even more Pain Location: BLE Pain Descriptors / Indicators: Discomfort, Grimacing Pain Intervention(s): Limited activity within patient's tolerance, Repositioned     Extremity/Trunk Assessment Upper Extremity Assessment Upper Extremity Assessment: Generalized weakness   Lower Extremity Assessment Lower Extremity Assessment: Generalized weakness       Communication Communication Communication: Impaired Factors Affecting Communication: Reduced clarity of speech   Cognition Arousal: Alert Behavior During Therapy: WFL for tasks assessed/performed Cognition: No family/caregiver present to determine baseline             OT - Cognition Comments: garbled speech, follows all commands  Following commands: Intact       Cueing  General Comments          Exercises     Shoulder Instructions      Home Living Family/patient expects to be discharged to:: Private residence Living Arrangements: Children Available Help at Discharge: Family;Available PRN/intermittently Type of Home: House Home Access: Stairs to enter Entrance Stairs-Number of Steps: 3 Entrance Stairs-Rails: None Home Layout: One level                Home Equipment: [employer]ant (2 wheels);Cane - single point   Additional Comments: daughter works      Prior Functioning/Environment Prior Level of Function : Needs assist             Mobility Comments: reports limited household distances with AD as needed ADLs Comments: I'm lucky if I brush my teeth once or twoce a week    OT Problem List: Decreased strength;Decreased activity tolerance;Impaired balance (sitting and/or standing)   OT Treatment/Interventions: Self-care/ADL training;Therapeutic exercise;Energy conservation;DME and/or AE instruction;Therapeutic activities;Patient/family education      OT Goals(Current goals can be found in the care plan section)   Acute Rehab OT Goals Patient Stated Goal: to go home OT Goal Formulation: With patient Time For Goal Achievement: 10/22/24 Potential to Achieve Goals: Good ADL Goals Pt Will Perform Grooming: with modified independence;standing Pt Will Perform Lower Body Dressing: with modified independence;sit to/from stand Pt Will Transfer to Toilet: with modified independence;ambulating;regular height toilet   OT Frequency:  Min 2X/week    Co-evaluation              AM-PAC OT 6 Clicks Daily Activity     Outcome Measure Help from another person eating meals?: None Help from another person taking care of personal grooming?: A Little Help from another person toileting, which includes using toliet, bedpan, or urinal?: A Little Help from another person bathing (including washing, rinsing, drying)?: A Little Help from another person to put on and taking off regular upper body clothing?: None Help from another person to put on and taking off regular lower body clothing?: A Little 6 Click Score: 20   End of Session Equipment Utilized During Treatment: Rolling walker (2 wheels)  Activity Tolerance: Patient tolerated treatment well Patient left: in bed;with call bell/phone within reach;with bed alarm set  OT Visit  Diagnosis: Other abnormalities of gait and mobility (R26.89);Muscle weakness (generalized) (M62.81)                Time: 8992-8973 OT Time Calculation (min): 19 min Charges:  OT General Charges $OT Visit: 1 Visit OT Evaluation $OT Eval Low Complexity: 1 Low OT Treatments $Self Care/Home Management : 8-22 mins  Drew Holt, M.S. OTR/L  10/08/24, 10:55 AM  ascom [phone]

## 2024-10-08 NOTE — Progress Notes (Signed)
 Patient forgot to urinate in the cup sample hence havent been able yet to collect sample if patient does not urinate again before 1900 will pass information to night shift that sample still needs to be collected

## 2024-10-09 ENCOUNTER — Other Ambulatory Visit: Payer: Self-pay

## 2024-10-09 DIAGNOSIS — R6520 Severe sepsis without septic shock: Secondary | ICD-10-CM | POA: Diagnosis not present

## 2024-10-09 DIAGNOSIS — J189 Pneumonia, unspecified organism: Secondary | ICD-10-CM | POA: Diagnosis not present

## 2024-10-09 DIAGNOSIS — E43 Unspecified severe protein-calorie malnutrition: Secondary | ICD-10-CM | POA: Diagnosis not present

## 2024-10-09 DIAGNOSIS — A419 Sepsis, unspecified organism: Secondary | ICD-10-CM | POA: Diagnosis not present

## 2024-10-09 LAB — CBC
HCT: 27.7 % — ABNORMAL LOW (ref 39.0–52.0)
HCT: 27.1 % — ABNORMAL LOW (ref 39.0–52.0)
Hemoglobin: 8.8 g/dL — ABNORMAL LOW (ref 13.0–17.0)
Hemoglobin: 8.6 g/dL — ABNORMAL LOW (ref 13.0–17.0)
MCH: 30.3 pg (ref 26.0–34.0)
MCH: 30.6 pg (ref 26.0–34.0)
MCHC: 31.8 g/dL (ref 30.0–36.0)
MCHC: 31.7 g/dL (ref 30.0–36.0)
MCV: 95.5 fL (ref 80.0–100.0)
MCV: 96.4 fL (ref 80.0–100.0)
Platelets: 341 K/uL (ref 150–400)
Platelets: 334 K/uL (ref 150–400)
RBC: 2.9 MIL/uL — ABNORMAL LOW (ref 4.22–5.81)
RBC: 2.81 MIL/uL — ABNORMAL LOW (ref 4.22–5.81)
RDW: 13.7 % (ref 11.5–15.5)
RDW: 13.7 % (ref 11.5–15.5)
WBC: 16.7 K/uL — ABNORMAL HIGH (ref 4.0–10.5)
WBC: 15.8 K/uL — ABNORMAL HIGH (ref 4.0–10.5)
nRBC: 0 % (ref 0.0–0.2)
nRBC: 0 % (ref 0.0–0.2)

## 2024-10-09 LAB — BASIC METABOLIC PANEL WITH GFR
Anion gap: 10 (ref 5–15)
BUN: 9 mg/dL (ref 8–23)
CO2: 28 mmol/L (ref 22–32)
Calcium: 8.6 mg/dL — ABNORMAL LOW (ref 8.9–10.3)
Chloride: 97 mmol/L — ABNORMAL LOW (ref 98–111)
Creatinine, Ser: 0.75 mg/dL (ref 0.61–1.24)
GFR, Estimated: >60 mL/min
Glucose, Bld: 105 mg/dL — ABNORMAL HIGH (ref 70–99)
Potassium: 4.6 mmol/L (ref 3.5–5.1)
Sodium: 135 mmol/L (ref 135–145)

## 2024-10-09 LAB — MAGNESIUM: Magnesium: 1.9 mg/dL (ref 1.7–2.4)

## 2024-10-09 LAB — STREP PNEUMONIAE URINARY ANTIGEN: Strep Pneumo Urinary Antigen: NEGATIVE

## 2024-10-09 MED ORDER — FOLIC ACID 1 MG PO TABS
1.0000 mg | ORAL_TABLET | Freq: Every day | ORAL | 0 refills | Status: AC
  Filled 2024-10-09: qty 30, 30d supply, fill #0

## 2024-10-09 MED ORDER — VITAMIN B-12 1000 MCG PO TABS
1000.0000 ug | ORAL_TABLET | Freq: Every day | ORAL | 0 refills | Status: AC
  Filled 2024-10-09: qty 90, 90d supply, fill #0

## 2024-10-09 MED ORDER — DOXYCYCLINE HYCLATE 100 MG PO TABS
100.0000 mg | ORAL_TABLET | Freq: Two times a day (BID) | ORAL | 0 refills | Status: AC
  Filled 2024-10-09: qty 8, 4d supply, fill #0

## 2024-10-09 MED ORDER — ALBUTEROL SULFATE HFA 108 (90 BASE) MCG/ACT IN AERS
2.0000 | INHALATION_SPRAY | Freq: Four times a day (QID) | RESPIRATORY_TRACT | 0 refills | Status: AC | PRN
  Filled 2024-10-09: qty 6.7, 25d supply, fill #0

## 2024-10-09 MED ORDER — AMOXICILLIN-POT CLAVULANATE 875-125 MG PO TABS
1.0000 | ORAL_TABLET | Freq: Two times a day (BID) | ORAL | 0 refills | Status: AC
  Filled 2024-10-09: qty 8, 4d supply, fill #0

## 2024-10-09 NOTE — Evaluation (Signed)
 Physical Therapy Evaluation Patient Details Name: Terique Kawabata MRN: [id number] DOB: 11-Jun-1956 Today's Date: 10/09/2024  History of Present Illness  Shaheed Schmuck is a 69 y.o. 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  Clinical Impression  Patient seated in recliner upon arrival to session; alert and oriented, follows commands and agreeable to participation with treatment session.  Patient eager for upcoming discharge. Bilat UE/LE strength and ROM grossly symmetrical and WFL; no focal weakness noted, but mild/moderate ataxia with all functional movement (due to baseline polyneuropathy).  Does endorse baseline paresthesia/neuropathy, bilat knees distally, unchanged with this admission. Able to complete sit/stand, standing balance and household-distance gait (30') with RW, cga/min assist. Demonstrates forward flexed posture with RW arms-length anterior to patient; mildly antalgic with mild/mod ataxia bilat LEs (worsens with fatigue).  Limited balance reactions evident; recommend consistent use of RW and +1 sup/assist as needed at this time.  Mod SOB with gait efforts; sats >97% on RA throughout.  Recovers to baseline within 60 seconds of seated rest. Would benefit from skilled PT to address above deficits and promote optimal return to PLOF.; recommend post-acute PT follow up as indicated by interdisciplinary care team.              If plan is discharge home, recommend the following: A little help with walking and/or transfers;A little help with bathing/dressing/bathroom   Can travel by private vehicle        Equipment Recommendations    Recommendations for  Other Services       Functional Status Assessment Patient has had a recent decline in their functional status and demonstrates the ability to make significant improvements in function in a reasonable and predictable amount of time.     Precautions / Restrictions Precautions Precautions: Fall Recall of Precautions/Restrictions: Intact Restrictions Weight Bearing Restrictions Per Provider Order: No      Mobility  Bed Mobility               General bed mobility comments: seated in recliner beginning/end of treatment session    Transfers Overall transfer level: Needs assistance Equipment used: Rolling walker (2 wheels), None Transfers: Sit to/from Stand Sit to Stand: Supervision                Ambulation/Gait Ambulation/Gait assistance: Contact guard assist Gait Distance (Feet): 30 Feet Assistive device: Rolling walker (2 wheels)         General Gait Details: forward flexed posture with RW arms-length anterior to patient; mildly antalgic with mild/mod ataxia bilat LEs (worsens with fatigue).  Stairs            Wheelchair Mobility     Tilt Bed    Modified Rankin (Stroke Patients Only)       Balance                                             Pertinent Vitals/Pain Pain Assessment Pain Assessment: No/denies pain    Home Living Family/patient expects to be discharged to:: Private residence Living Arrangements: Children Available Help at Discharge: Family;Available PRN/intermittently Type of Home: House Home Access: Stairs to enter Entrance Stairs-Rails: None Entrance [employer]  of Steps: 3   Home Layout: One level Home Equipment: Agricultural Consultant (2 wheels);Cane - single point Additional Comments: daughter works    Prior Function Prior Level of Function : Needs assist             Mobility Comments: reports limited household distances with AD as needed       Extremity/Trunk Assessment   Upper Extremity  Assessment Upper Extremity Assessment: Generalized weakness    Lower Extremity Assessment Lower Extremity Assessment: Generalized weakness (grossly at least 4-/5 throughout; generally ataxic with decreased gross coordination.  Baseline sensory deficits knees distally.)       Communication   Communication Communication: Impaired Factors Affecting Communication: Reduced clarity of speech    Cognition Arousal: Alert Behavior During Therapy: WFL for tasks assessed/performed                             Following commands: Intact       Cueing       General Comments      Exercises Other Exercises Other Exercises: Seated in recliner--upper body dressing, set up/sup; lower body dressing, set up/sup.  sit/stand without assist device to pull pants over hips, cga/close sup; broad BOS, does require UE support to stabilize.   Assessment/Plan    PT Assessment Patient needs continued PT services  PT Problem List Decreased strength;Decreased activity tolerance;Decreased balance;Decreased mobility;Decreased coordination;Decreased knowledge of use of DME;Decreased safety awareness;Decreased knowledge of precautions;Cardiopulmonary status limiting activity       PT Treatment Interventions DME instruction;Gait training;Functional mobility training;Therapeutic activities;Stair training;Therapeutic exercise;Balance training;Neuromuscular re-education;Patient/family education    PT Goals (Current goals can be found in the Care Plan section)  Acute Rehab PT Goals Patient Stated Goal: to go home PT Goal Formulation: With patient Time For Goal Achievement: 10/23/24 Potential to Achieve Goals: Fair    Frequency Min 2X/week     Co-evaluation               AM-PAC PT 6 Clicks Mobility  Outcome Measure Help needed turning from your back to your side while in a flat bed without using bedrails?: None Help needed moving from lying on your back to sitting on the side of a  flat bed without using bedrails?: None Help needed moving to and from a bed to a chair (including a wheelchair)?: None Help needed standing up from a chair using your arms (e.g., wheelchair or bedside chair)?: A Little Help needed to walk in hospital room?: A Little Help needed climbing 3-5 steps with a railing? : A Little 6 Click Score: 21    End of Session   Activity Tolerance: Patient tolerated treatment well Patient left: in chair;with call bell/phone within reach;with chair alarm set Nurse Communication: Mobility status PT Visit Diagnosis: Muscle weakness (generalized) (M62.81);Difficulty in walking, not elsewhere classified (R26.2)    Time: 8960-8897 PT Time Calculation (min) (ACUTE ONLY): 23 min   Charges:   PT Evaluation $PT Eval Moderate Complexity: 1 Mod   PT General Charges $$ ACUTE PT VISIT: 1 Visit        Antwaine Boomhower H. Delores, PT, DPT, NCS 10/09/24, 1:58 PM [phone]

## 2024-10-09 NOTE — Plan of Care (Signed)
" °  Problem: Clinical Measurements: Goal: Signs and symptoms of infection will decrease Outcome: Progressing   Problem: Clinical Measurements: Goal: Will remain free from infection Outcome: Progressing Goal: Respiratory complications will improve Outcome: Progressing Goal: Cardiovascular complication will be avoided Outcome: Progressing   "

## 2024-10-09 NOTE — Discharge Summary (Signed)
 " Physician Discharge Summary   Patient: Drew Holt MRN: [id number] DOB: May 18, 1956  Admit date:     10/06/2024  Discharge date: 10/09/24  Discharge Physician: Murvin Mana   PCP: Geralene Levorn ORN, NP   Recommendations at discharge:   Follow-up with PCP in 1 week. Repeat chest x-ray in 8 weeks to make sure resolution of pneumonia. Check a CBC in 1 week.  Discharge Diagnoses: Principal Problem:   Severe sepsis (HCC) Active Problems:   CAP (community acquired pneumonia)   Protein calorie malnutrition   AKI (acute kidney injury)   Diffuse sensory axonal peripheral polyneuropathy   Abnormality of gait due to impairment of balance   Chronic anemia   Protein-calorie malnutrition, severe   Folate deficiency anemia   Reactive thrombocytosis   Hyponatremia  Resolved Problems:   * No resolved hospital problems. *  Hospital Course: Drew Holt is a 69 y.o. male with medical history significant for Diffuse sensory axonal peripheral polyneuropathy with resulting gait impairment/poor mobility previously followed by [facility] neurology,  protein calorie malnutrition with nutritional deficiencies, homebound status due to his neurologic illness, with last hospitalization 06/2023 for multifocal pneumonia, present to the hospital with increased cough and short of breath for 3 days. Upon arriving to hospital, patient had a signal leukocytosis at 31,000, heart rate 112, respiratory 24, lactic acid peaked at 2.4. Chest x-ray showed left lower lung consolidation.  Patient started on antibiotics with Rocephin  and Zithromax . Patient is seen by speech therapy, was placed on dysphagia 3 diet. Patient condition has improved, no need for oxygen.  Short of breath better.  Leukocytosis much improved.  Patient medically stable for discharge.   Assessment and Plan: Severe sepsis (HCC) Community-acquired pneumonia left lung Dysphagia Severe sepsis criteria include low-grade temperature, tachycardia and tachypnea  with leukocytosis, AKI, lactic acidosis. Patient has received IV fluids, lactic acid level and renal function are normalized. Reviewed patient chest x-ray image, compared to images study performed on 06/2024, currently patient has a left lower lung dense consolidation.  .  Patient also has significant dysphagia, recurrent pneumonia could be from aspiration.  Speech therapy evaluation obtained, recommend dysphagia 3 diet. Patient condition has improved, no hypoxemia, short of breath better.  Will continue finish out 5 days antibiotics with oral Augmentin  and doxycycline .    AKI (acute kidney injury) Hyponatremia. Renal function has normalized following IV fluids.  Hyponatremia resolved.   Protein calorie malnutrition, severe. BMI 14.9 Encourage p.o. intake.   Anemia secondary to B12 and folic acid  deficiency. Started B12 and folic acid  supplement.  Patient has a low iron, but increased ferritin, no need for iron.     Diffuse sensory axonal peripheral polyneuropathy Gait abnormality due to balance impairment Homebound PT recommended home care.         Consultants: None Procedures performed: None  Disposition: Home health Diet recommendation:  Dysphagia type 3 thin Liquid DISCHARGE MEDICATION: Allergies as of 10/09/2024   No Known Allergies      Medication List     STOP taking these medications    azithromycin  250 MG tablet Commonly known as: Zithromax    HYDROcodone -acetaminophen  10-325 MG tablet Commonly known as: NORCO   naloxone 4 MG/0.1ML Liqd nasal spray kit Commonly known as: NARCAN   polyethylene glycol 17 g packet Commonly known as: MIRALAX  / GLYCOLAX    sennosides-docusate sodium  8.6-50 MG tablet Commonly known as: SENOKOT-S       TAKE these medications    albuterol  108 (90 Base) MCG/ACT inhaler Commonly known as: VENTOLIN   HFA Inhale 2 puffs into the lungs every 6 (six) hours as needed.   amoxicillin -clavulanate 875-125 MG tablet Commonly known  as: AUGMENTIN  Take 1 tablet by mouth 2 (two) times daily for 4 days. Starting on 10/15   cholecalciferol 25 MCG (1000 UNIT) tablet Commonly known as: VITAMIN D3 Take 1,000 Units by mouth daily.   cyanocobalamin  1000 MCG tablet Commonly known as: VITAMIN B12 Take 1 tablet (1,000 mcg total) by mouth daily.   diphenhydrAMINE  25 MG tablet Commonly known as: BENADRYL  Take 25-50 mg by mouth daily as needed for allergies or itching.   doxycycline  100 MG tablet Commonly known as: VIBRA -TABS Take 1 tablet (100 mg total) by mouth every 12 (twelve) hours for 4 days.   ferrous sulfate  325 (65 FE) MG EC tablet Take 1 tablet (325 mg total) by mouth every other day.   folic acid  1 MG tablet Commonly known as: FOLVITE  Take 1 tablet (1 mg total) by mouth daily. Start taking on: October 10, 2024   ondansetron  4 MG disintegrating tablet Commonly known as: ZOFRAN -ODT Take 1 tablet (4 mg total) by mouth every 8 (eight) hours as needed for nausea or vomiting.   pantoprazole  40 MG tablet Commonly known as: Protonix  Take 1 tablet (40 mg total) by mouth daily.   therapeutic multivitamin-minerals tablet Take 1 tablet by mouth daily.               Discharge Care Instructions  (From admission, onward)           Start     Ordered   10/09/24 0000  Discharge wound care:       Comments: Follow with pcp   10/09/24 1007            Follow-up Information     Geralene Levorn ORN, NP Follow up in 1 week(s).   Why: hospital follow up Contact information: [address] [place] [address] [phone]                Discharge Exam: Filed Weights   10/06/24 2328 10/07/24 0431  Weight: 39.4 kg 44.8 kg   General exam: Appears calm and comfortable, severely malnourished Respiratory system: Some decreased breath sounds. Respiratory effort normal. Cardiovascular system: S1 & S2 heard, RRR. No JVD, murmurs, rubs, gallops or clicks. No pedal edema. Gastrointestinal system:  Abdomen is nondistended, soft and nontender. No organomegaly or masses felt. Normal bowel sounds heard. Central nervous system: Alert and oriented. No focal neurological deficits. Extremities: Symmetric 5 x 5 power. Skin: No rashes, lesions or ulcers Psychiatry: Judgement and insight appear normal. Mood & affect appropriate.    Condition at discharge: fair  The results of significant diagnostics from this hospitalization (including imaging, microbiology, ancillary and laboratory) are listed below for reference.   Imaging Studies: DG Chest Port 1 View Result Date: 10/07/2024 EXAM: 1 VIEW(S) XRAY OF THE CHEST 10/07/2024 12:15:52 AM COMPARISON: CT chest dated 07/01/2023. CLINICAL HISTORY: cough, fever FINDINGS: LUNGS AND PLEURA: Ill-defined airspace opacity in left mid lung, favoring left lung pneumonia. No pleural effusion. No pneumothorax. Follow-up chest radiographs are suggested in 4-6 weeks. HEART AND MEDIASTINUM: No acute abnormality of the cardiac and mediastinal silhouettes. BONES AND SOFT TISSUES: No acute osseous abnormality. IMPRESSION: 1. Ill-defined airspace opacity in the left mid lung, favoring left lung pneumonia. 2. Follow-up chest radiographs are suggested in 4-6 weeks to document clearance. Electronically signed by: Pinkie Pebbles MD 10/07/2024 12:19 AM EST RP Workstation: [id number]    Microbiology: Results for orders  placed or performed during the hospital encounter of 10/06/24  Resp panel by RT-PCR (RSV, Flu A&B, Covid) Anterior Nasal Swab     Status: None   Collection Time: 10/07/24 12:41 AM   Specimen: Anterior Nasal Swab  Result Value Ref Range Status   SARS Coronavirus 2 by RT PCR NEGATIVE NEGATIVE Final    Comment: (NOTE) SARS-CoV-2 target nucleic acids are NOT DETECTED.  The SARS-CoV-2 RNA is generally detectable in upper respiratory specimens during the acute phase of infection. The lowest concentration of SARS-CoV-2 viral copies this assay can detect is 138  copies/mL. A negative result does not preclude SARS-Cov-2 infection and should not be used as the sole basis for treatment or other patient management decisions. A negative result may occur with  improper specimen collection/handling, submission of specimen other than nasopharyngeal swab, presence of viral mutation(s) within the areas targeted by this assay, and inadequate number of viral copies(<138 copies/mL). A negative result must be combined with clinical observations, patient history, and epidemiological information. The expected result is Negative.  Fact Sheet for Patients:  bloggercourse.com  Fact Sheet for Healthcare Providers:  seriousbroker.it  This test is no t yet approved or cleared by the [place]  FDA and  has been authorized for detection and/or diagnosis of SARS-CoV-2 by FDA under an Emergency Use Authorization (EUA). This EUA will remain  in effect (meaning this test can be used) for the duration of the COVID-19 declaration under Section 564(b)(1) of the Act, 21 U.S.C.section 360bbb-3(b)(1), unless the authorization is terminated  or revoked sooner.       Influenza A by PCR NEGATIVE NEGATIVE Final   Influenza B by PCR NEGATIVE NEGATIVE Final    Comment: (NOTE) The Xpert Xpress SARS-CoV-2/FLU/RSV plus assay is intended as an aid in the diagnosis of influenza from Nasopharyngeal swab specimens and should not be used as a sole basis for treatment. Nasal washings and aspirates are unacceptable for Xpert Xpress SARS-CoV-2/FLU/RSV testing.  Fact Sheet for Patients: bloggercourse.com  Fact Sheet for Healthcare Providers: seriousbroker.it  This test is not yet approved or cleared by the [place]  FDA and has been authorized for detection and/or diagnosis of SARS-CoV-2 by FDA under an Emergency Use Authorization (EUA). This EUA will remain in effect (meaning  this test can be used) for the duration of the COVID-19 declaration under Section 564(b)(1) of the Act, 21 U.S.C. section 360bbb-3(b)(1), unless the authorization is terminated or revoked.     Resp Syncytial Virus by PCR NEGATIVE NEGATIVE Final    Comment: (NOTE) Fact Sheet for Patients: bloggercourse.com  Fact Sheet for Healthcare Providers: seriousbroker.it  This test is not yet approved or cleared by the [place]  FDA and has been authorized for detection and/or diagnosis of SARS-CoV-2 by FDA under an Emergency Use Authorization (EUA). This EUA will remain in effect (meaning this test can be used) for the duration of the COVID-19 declaration under Section 564(b)(1) of the Act, 21 U.S.C. section 360bbb-3(b)(1), unless the authorization is terminated or revoked.  Performed at [facility], [address] [place], [place] [address]   Blood Culture (routine x 2)     Status: None (Preliminary result)   Collection Time: 10/07/24 12:41 AM   Specimen: BLOOD  Result Value Ref Range Status   Specimen Description BLOOD BLOOD LEFT FOREARM  Final   Special Requests   Final    BOTTLES DRAWN AEROBIC AND ANAEROBIC Blood Culture results may not be optimal due to an inadequate volume of blood received in  culture bottles   Culture   Final    NO GROWTH 2 DAYS Performed at [facility], [address] [place], [place] [address]    Report Status PENDING  Incomplete  Blood Culture (routine x 2)     Status: None (Preliminary result)   Collection Time: 10/07/24 12:41 AM   Specimen: BLOOD  Result Value Ref Range Status   Specimen Description BLOOD BLOOD RIGHT FOREARM  Final   Special Requests   Final    BOTTLES DRAWN AEROBIC AND ANAEROBIC Blood Culture results may not be optimal due to an inadequate volume of blood received in culture bottles   Culture   Final    NO GROWTH 2 DAYS Performed at [facility], [address] [place], [place] [address]    Report Status PENDING  Incomplete    Labs: CBC: Recent Labs  Lab 10/07/24 0001 10/07/24 0407 10/08/24 0637 10/09/24 0525 10/09/24 0802  WBC 31.4* 34.5* 27.5* 16.7* 15.8*  NEUTROABS 27.6*  --  24.3*  --   --   HGB 10.0* 9.0* 9.0* 8.8* 8.6*  HCT 31.5* 28.1* 27.9* 27.7* 27.1*  MCV 96.9 98.9 95.2 95.5 96.4  PLT 423* 376 356 341 334   Basic Metabolic Panel: Recent Labs  Lab 10/07/24 0001 10/07/24 0407 10/08/24 0637 10/09/24 0802  NA 135 136 131* 135  K 4.3 4.8 4.7 4.6  CL 95* 98 97* 97*  CO2 26 26 25 28   GLUCOSE 128* 122* 91 105*  BUN 19 15 12 9   CREATININE 1.54* 1.24 0.85 0.75  CALCIUM 9.1 8.6* 8.4* 8.6*  MG  --   --   --  1.9  PHOS 2.9  --   --   --    Liver Function Tests: Recent Labs  Lab 10/07/24 0001  AST 18  18  ALT 9  9  ALKPHOS 95  96  BILITOT 0.8  0.9  PROT 8.7*  8.7*  ALBUMIN 3.7  3.8   CBG: No results for input(s): GLUCAP in the last 168 hours.  Discharge time spent: 35 minutes.  Signed: Murvin Mana, MD [facility] 10/09/2024 "

## 2024-10-10 LAB — LEGIONELLA PNEUMOPHILA SEROGP 1 UR AG: L. pneumophila Serogp 1 Ur Ag: NEGATIVE

## 2024-10-12 LAB — CULTURE, BLOOD (ROUTINE X 2)
Culture: NO GROWTH
Culture: NO GROWTH
